# Patient Record
Sex: Female | Born: 1960 | Race: White | Hispanic: No | Marital: Married | Smoking: Never smoker
Health system: Southern US, Community
[De-identification: ages and names within clinical notes are randomized; demographics above are authoritative.]

## PROBLEM LIST (undated history)

## (undated) DIAGNOSIS — K219 Gastro-esophageal reflux disease without esophagitis: Secondary | ICD-10-CM

## (undated) DIAGNOSIS — E079 Disorder of thyroid, unspecified: Secondary | ICD-10-CM

## (undated) DIAGNOSIS — C50412 Malignant neoplasm of upper-outer quadrant of left female breast: Principal | ICD-10-CM

## (undated) DIAGNOSIS — C50919 Malignant neoplasm of unspecified site of unspecified female breast: Secondary | ICD-10-CM

## (undated) DIAGNOSIS — M1990 Unspecified osteoarthritis, unspecified site: Secondary | ICD-10-CM

## (undated) DIAGNOSIS — E039 Hypothyroidism, unspecified: Secondary | ICD-10-CM

## (undated) HISTORY — PX: ABDOMINAL HYSTERECTOMY: SHX81

## (undated) HISTORY — DX: Unspecified osteoarthritis, unspecified site: M19.90

## (undated) HISTORY — DX: Malignant neoplasm of unspecified site of unspecified female breast: C50.919

## (undated) HISTORY — DX: Disorder of thyroid, unspecified: E07.9

## (undated) HISTORY — DX: Malignant neoplasm of upper-outer quadrant of left female breast: C50.412

---

## 2015-03-27 ENCOUNTER — Other Ambulatory Visit: Payer: Self-pay | Admitting: *Deleted

## 2015-03-27 DIAGNOSIS — R200 Anesthesia of skin: Secondary | ICD-10-CM

## 2015-03-28 ENCOUNTER — Ambulatory Visit (INDEPENDENT_AMBULATORY_CARE_PROVIDER_SITE_OTHER): Payer: No Typology Code available for payment source | Admitting: Neurology

## 2015-03-28 DIAGNOSIS — R200 Anesthesia of skin: Secondary | ICD-10-CM

## 2015-03-28 DIAGNOSIS — M79602 Pain in left arm: Secondary | ICD-10-CM

## 2015-03-28 NOTE — Procedures (Signed)
[facility]  [address]  [place], [place] [address] Tel: [phone] Fax:  [phone] Test Date:  03/28/2015  Patient: Brooke Garrison DOB: 04-03-1961 Physician: Narda Amber  Sex: Female Height: 5' " Ref Phys: Sid Falcon, M.D.  ID#: [id number] Temp: 32.2C Technician: Jerilynn Mages. Dean   Patient Complaints: This is a 54 year old female with history of rheumatoid arthritis presenting for evaluation of bilateral hand pain and left hand paresthesias.   NCV & EMG Findings: Extensive electrodiagnostic testing of the left upper extremity and additional studies of the right shows:  1. All sensory responses including bilateral median, left radial, left ulnar, and bilateral palmar sensory responses are within normal limits.  2. Bilateral median and left ulnar motor responses within normal limits.  3. There is no evidence of active or chronic motor axon loss affecting any of the tested muscles. Motor unit configuration and recruitment pattern is within normal limits.   Impression: This is a normal study of the left upper extremity. In particular, there is no evidence of carpal tunnel syndrome or a cervical radiculopathy.   ___________________________ Narda Amber, DO    Nerve Conduction Studies Anti Sensory Summary Table   Stim Site NR Peak (ms) Norm Peak (ms) P-T Amp (V) Norm P-T Amp  Left Median Anti Sensory (2nd Digit)  32.2C  Wrist    2.9 <3.6 27.1 >15  Right Median Anti Sensory (2nd Digit)  32.2C  Wrist    3.0 <3.6 29.6 >15  Left Radial Anti Sensory (Base 1st Digit)  32.2C  Wrist    1.9 <2.7 48.7 >14  Left Ulnar Anti Sensory (5th Digit)  32.2C  Wrist    2.9 <3.1 25.6 >10   Motor Summary Table   Stim Site NR Onset (ms) Norm Onset (ms) O-P Amp (mV) Norm O-P Amp Site1 Site2 Delta-0 (ms) Dist (cm) Vel (m/s) Norm Vel (m/s)  Left Median Motor (Abd Poll Brev)  32.2C  Wrist    2.9 <4.0 9.3 >6 Elbow Wrist 3.7 20.0 54 >50  Elbow    6.6  8.1         Right  Median Motor (Abd Poll Brev)  32.2C  Wrist    2.9 <4.0 8.9 >6 Elbow Wrist 3.7 21.0 57 >50  Elbow    6.6  8.2         Left Ulnar Motor (Abd Dig Minimi)  32.2C  Wrist    2.6 <3.1 7.8 >7 B Elbow Wrist 2.6 16.0 62 >50  B Elbow    5.2  7.1  A Elbow B Elbow 1.6 10.0 63 >50  A Elbow    6.8  6.3          Comparison Summary Table   Stim Site NR Peak (ms) Norm Peak (ms) P-T Amp (V) Site1 Site2 Delta-P (ms) Norm Delta (ms)  Left Median/Ulnar Palm Comparison (Wrist - 8cm)  32.2C  Median Palm    1.8 <2.2 79.0 Median Palm Ulnar Palm 0.1   Ulnar Palm    1.7 <2.2 19.3      Right Median/Ulnar Palm Comparison (Wrist - 8cm)  32.2C  Median Palm    1.7 <2.2 59.7 Median Palm Ulnar Palm 0.1   Ulnar Palm    1.8 <2.2 24.2       EMG   Side Muscle Ins Act Fibs Psw Fasc Number Recrt Dur Dur. Amp Amp. Poly Poly. Comment  Left 1stDorInt Nml Nml Nml Nml Nml Nml Nml Nml Nml Nml Nml Nml N/A  Left Ext Indicis Nml Nml Nml Nml Nml Nml Nml Nml Nml Nml Nml Nml N/A  Left PronatorTeres Nml Nml Nml Nml Nml Nml Nml Nml Nml Nml Nml Nml N/A  Left Biceps Nml Nml Nml Nml Nml Nml Nml Nml Nml Nml Nml Nml N/A  Left Triceps Nml Nml Nml Nml Nml Nml Nml Nml Nml Nml Nml Nml N/A  Left Deltoid Nml Nml Nml Nml Nml Nml Nml Nml Nml Nml Nml Nml N/A      Waveforms:

## 2015-03-31 ENCOUNTER — Telehealth: Payer: Self-pay | Admitting: Neurology

## 2015-03-31 NOTE — Telephone Encounter (Signed)
Brooke Garrison, Dr. Charlestine Massed office/ called requesting results from nerve conduction study/ call back [phone]///fax # [phone]

## 2015-03-31 NOTE — Telephone Encounter (Signed)
Results faxed.

## 2015-09-18 ENCOUNTER — Other Ambulatory Visit: Payer: Self-pay | Admitting: Obstetrics & Gynecology

## 2015-09-18 DIAGNOSIS — N6325 Unspecified lump in the left breast, overlapping quadrants: Secondary | ICD-10-CM

## 2015-09-18 DIAGNOSIS — N6320 Unspecified lump in the left breast, unspecified quadrant: Secondary | ICD-10-CM

## 2015-09-25 ENCOUNTER — Ambulatory Visit
Admission: RE | Admit: 2015-09-25 | Discharge: 2015-09-25 | Disposition: A | Discharge status: Home / Self Care | Payer: Managed Care, Other (non HMO) | Source: Ambulatory Visit | Attending: Obstetrics & Gynecology | Admitting: Obstetrics & Gynecology

## 2015-09-25 ENCOUNTER — Other Ambulatory Visit: Payer: Self-pay | Admitting: Obstetrics & Gynecology

## 2015-09-25 DIAGNOSIS — N6325 Unspecified lump in the left breast, overlapping quadrants: Secondary | ICD-10-CM

## 2015-09-25 DIAGNOSIS — N6320 Unspecified lump in the left breast, unspecified quadrant: Principal | ICD-10-CM

## 2015-09-27 ENCOUNTER — Other Ambulatory Visit: Payer: Self-pay | Admitting: Obstetrics & Gynecology

## 2015-09-27 ENCOUNTER — Ambulatory Visit
Admission: RE | Admit: 2015-09-27 | Discharge: 2015-09-27 | Disposition: A | Discharge status: Home / Self Care | Payer: Managed Care, Other (non HMO) | Source: Ambulatory Visit | Attending: Obstetrics & Gynecology | Admitting: Obstetrics & Gynecology

## 2015-09-27 DIAGNOSIS — N6325 Unspecified lump in the left breast, overlapping quadrants: Secondary | ICD-10-CM

## 2015-09-27 DIAGNOSIS — N6320 Unspecified lump in the left breast, unspecified quadrant: Principal | ICD-10-CM

## 2015-09-29 ENCOUNTER — Encounter: Payer: Self-pay | Admitting: *Deleted

## 2015-09-29 ENCOUNTER — Telehealth: Payer: Self-pay | Admitting: *Deleted

## 2015-09-29 DIAGNOSIS — C50412 Malignant neoplasm of upper-outer quadrant of left female breast: Secondary | ICD-10-CM | POA: Insufficient documentation

## 2015-09-29 HISTORY — DX: Malignant neoplasm of upper-outer quadrant of left female breast: C50.412

## 2015-09-29 NOTE — Telephone Encounter (Signed)
Confirmed [facility] for 10/04/15 at 0815.  Instructions and contact information given.

## 2015-09-29 NOTE — Telephone Encounter (Signed)
Mailed clinic packet to pt.  

## 2015-10-03 NOTE — Progress Notes (Signed)
[facility]  Telephone:([phone]) [phone] Fax:([phone] [place] Note   Patient Care Team: Larence Penning, MD as PCP - General (Internal Medicine) Deliah Goody (Specialist) Janyth Pupa, DO as Consulting Physician (Obstetrics and Gynecology) 10/04/2015  CHIEF COMPLAINTS/PURPOSE OF CONSULTATION:  Newly diagnosed left breast cancer  Oncology History   Breast cancer of upper-outer quadrant of left female breast [facility])   Staging form: Breast, AJCC 7th Edition     Clinical stage from 09/28/2015: Stage IIA (T2, N0, M0) - Signed by Truitt Merle, MD on 10/03/2015       Breast cancer of upper-outer quadrant of left female breast ([place])   09/27/2015 Initial Diagnosis Breast cancer of upper-outer quadrant of left female breast ([place])   09/27/2015 Initial Biopsy Left breast core needle biopsy of the mass at the 3:00 position showed invasive and in situ ductal carcinoma, lymphovascular invasion is identified.   09/27/2015 Receptors her2 ER 95% positive, PR negative, HER-2 negative, Ki-67 60%    HISTORY OF PRESENTING ILLNESS:  Brooke Garrison 55 y.o. female is here because of er newly diagnosed left She is accompanied by her daughter-in-law her to our multidisciplinary breast clinic today.  She was found to have a palpable mass in her left breast by her GYN during her routine visit, no tenderness, skin change or nipple discharge. She denies any new syndroms, and feels well overall. she had mammogram one year ago which was normal, she had left breast biopsy which was benigh 20 years ago.   She ahs RA, on MTX and humaria for 4-5 years, she had significant multiple joint pain when she was diagnosed 6-7 years ago, and improved sighnitantly with treatment, she has mild joint pain, which get worse with physical exertion. She is married, liveswith her husband in [place], and works in [place]. Her husband had ruptured appendix about 6 weeks ago,is still recovering from surgery.    GYN HISTORY  Menarchal: 10 LMP: 2009 (hysrectomy)  Contraceptive:25 years  HRT: no  G2P2: she has 48 and 52 yo sons     MEDICAL HISTORY:  Past Medical History  Diagnosis Date  . Breast cancer of upper-outer quadrant of left female breast ([place]) 09/29/2015  . Breast cancer ([place])   . Arthritis   . Thyroid disease     SURGICAL HISTORY: Past Surgical History  Procedure Laterality Date  . Abdominal hysterectomy      SOCIAL HISTORY: Social History   Social History  . Marital Status: Unknown    Spouse Name: N/A  . Number of Children: N/A  . Years of Education: N/A   Occupational History  . Not on file.   Social History Main Topics  . Smoking status: Never Smoker   . Smokeless tobacco: Not on file  . Alcohol Use: Yes  . Drug Use: No  . Sexual Activity: Not on file   Other Topics Concern  . Not on file   Social History Narrative    FAMILY HISTORY: Family History  Problem Relation Age of Onset  . Hodgkin's lymphoma Mother   . Melanoma Maternal Grandfather     ALLERGIES:  is allergic to sulfa antibiotics.  MEDICATIONS:  Current Outpatient Prescriptions  Medication Sig Dispense Refill  . acyclovir (ZOVIRAX) 400 MG tablet Take 800 mg by mouth daily.  10  . cetirizine (ZYRTEC) 10 MG tablet Take 10 mg by mouth daily.    . folic acid (FOLVITE) 1 MG tablet Take 1 mg by mouth 2 (two) times daily.  5  . liothyronine (CYTOMEL) 5 MCG tablet Take 5 mcg by mouth daily.  5  . methotrexate (RHEUMATREX) 2.5 MG tablet Take 15 mg by mouth once a week. 6 tablets weekly.  3  . naproxen (NAPROSYN) 500 MG tablet Take 500 mg by mouth as needed.    Marland Kitchen TIROSINT 75 MCG CAPS TAKE ONE TABLET 6 DAYS A WEEK  6  . traMADol (ULTRAM) 50 MG tablet Take 50 mg by mouth as needed.     No current facility-administered medications for this visit.    REVIEW OF SYSTEMS:   Constitutional: Denies fevers, chills or abnormal night sweats Eyes: Denies blurriness of vision, double vision or watery  eyes Ears, nose, mouth, throat, and face: Denies mucositis or sore throat Respiratory: Denies cough, dyspnea or wheezes Cardiovascular: Denies palpitation, chest discomfort or lower extremity swelling Gastrointestinal:  Denies nausea, heartburn or change in bowel habits Skin: Denies abnormal skin rashes Lymphatics: Denies new lymphadenopathy or easy bruising Neurological:Denies numbness, tingling or new weaknesses Behavioral/Psych: Mood is stable, no new changes  All other systems were reviewed with the patient and are negative.  PHYSICAL EXAMINATION: ECOG PERFORMANCE STATUS: 1 - Symptomatic but completely ambulatory  Filed Vitals:   10/04/15 0850  BP: 116/77  Pulse: 64  Temp: 98 F (36.7 C)  Resp: 18   Filed Weights   10/04/15 0850  Weight: 185 lb 3.2 oz (84.006 kg)    GENERAL:alert, no distress and comfortable SKIN: skin color, texture, turgor are normal, no rashes or significant lesions EYES: normal, conjunctiva are pink and non-injected, sclera clear OROPHARYNX:no exudate, no erythema and lips, buccal mucosa, and tongue normal  NECK: supple, thyroid normal size, non-tender, without nodularity LYMPH:  no palpable lymphadenopathy in the cervical, axillary or inguinal LUNGS: clear to auscultation and percussion with normal breathing effort HEART: regular rate & rhythm and no murmurs and no lower extremity edema ABDOMEN:abdomen soft, non-tender and normal bowel sounds Musculoskeletal:no cyanosis of digits and no clubbing  PSYCH: alert & oriented x 3 with fluent speech NEURO: no focal motor/sensory deficits Breasts: Breast inspection showed them to be symmetrical with no nipple discharge. Palpation of the breasts and axilla revealed a 2.5X1.5cm mass in 3:00 position retroareolar. No other obvious mass that I could appreciate.   LABORATORY DATA:  I have reviewed the data as listed Lab Results  Component Value Date   WBC 8.2 10/04/2015   HGB 12.7 10/04/2015   HCT 38.6  10/04/2015   MCV 95.3 10/04/2015   PLT 248 10/04/2015    Recent Labs  10/04/15 0829  NA 141  K 4.0  CO2 28  GLUCOSE 119  BUN 18.7  CREATININE 0.8  CALCIUM 9.5  PROT 7.4  ALBUMIN 3.9  AST 17  ALT 17  ALKPHOS 68  BILITOT 0.57    PATHOLOGY REPORT   Diagnosis 09/27/2015 Breast, left, needle core biopsy, 3:00 o'clock, retroareolar - INVASIVE AND IN SITU MAMMARY CARCINOMA. - LYMPHOVASCULAR INVASION IS IDENTIFIED. - SEE COMMENT. Microscopic Comment The carcinoma appears grade 2. An E-cadherin stain will be performed and the results reported separately. A breast prognostic profile will be performed and the results reported separately. The results were called to [facility] on 09/28/2015. (JBK:ecj 09/28/2015) The malignant cells are positive for E-Cadherin, supporting a ductal phenotype.  Results: IMMUNOHISTOCHEMICAL AND MORPHOMETRIC ANALYSIS PERFORMED MANUALLY Estrogen Receptor: 95%, POSITIVE, STRONG STAINING INTENSITY Progesterone Receptor: 0%, NEGATIVE Proliferation Marker Ki67: 60% Results: HER2 - NEGATIVE RATIO OF HER2/CEP17 SIGNALS 1.04 AVERAGE HER2  COPY NUMBER PER CELL 1.20   RADIOGRAPHIC STUDIES: I have personally reviewed the radiological images as listed and agreed with the findings in the report. Mm Digital Diagnostic Unilat L  09/27/2015  CLINICAL DATA:  Status post ultrasound-guided core needle biopsy of a 2.3 cm mass in the 3 o'clock retroareolar left breast. EXAM: DIAGNOSTIC LEFT MAMMOGRAM POST ULTRASOUND BIOPSY COMPARISON:  Previous exam(s). FINDINGS: Mammographic images were obtained following ultrasound guided biopsy of the recently demonstrated 2.3 cm mass in the 3 o'clock retroareolar left breast. These demonstrate a ribbon shaped biopsy marker clip within the central portion of the biopsied mass. IMPRESSION: Appropriate clip deployment following left breast ultrasound-guided core needle biopsy. Final Assessment: Post Procedure Mammograms  for Marker Placement Electronically Signed   By: Claudie Revering M.D.   On: 09/27/2015 11:37   US Breast Ltd Uni Left Inc Axilla  09/25/2015  CLINICAL DATA:  Patient complains of a palpable abnormality in the left breast. EXAM: DIGITAL DIAGNOSTIC BILATERAL MAMMOGRAM WITH 3D TOMOSYNTHESIS WITH CAD ULTRASOUND LEFT BREAST COMPARISON:  Previous exam(s). ACR Breast Density Category b: There are scattered areas of fibroglandular density. FINDINGS: Parenchymal pattern of the right breast is stable compared to prior exams. No suspicious mass or malignant type microcalcifications identified. In the 2-3 o'clock region of the left breast is a spiculated 2.7 x 2.7 x 1.5 cm mass. There are no malignant type microcalcifications. No additional masses are seen in the left breast. Mammographic images were processed with CAD. On physical exam, I palpate a discrete hard mass in the left breast at 3 o'clock in the retroareolar location. Targeted ultrasound is performed, showing an irregular hypoechoic mass in the left breast at 3 o'clock in the retroareolar location measure 1.4 x 1.7 x 2.3 cm. Sonographic evaluation of the left axilla does not show any enlarged adenopathy. IMPRESSION: Suspicious left breast mass. RECOMMENDATION: Ultrasound-guided core biopsy has been scheduled on 09/27/2015 at 10 a.m. I have discussed the findings and recommendations with the patient. Results were also provided in writing at the conclusion of the visit. If applicable, a reminder letter will be sent to the patient regarding the next appointment. BI-RADS CATEGORY  5: Highly suggestive of malignancy. Electronically Signed   By: Lillia Mountain M.D.   On: 09/25/2015 14:51   Mm Diag Breast Tomo Bilateral  09/25/2015  CLINICAL DATA:  Patient complains of a palpable abnormality in the left breast. EXAM: DIGITAL DIAGNOSTIC BILATERAL MAMMOGRAM WITH 3D TOMOSYNTHESIS WITH CAD ULTRASOUND LEFT BREAST COMPARISON:  Previous exam(s). ACR Breast Density Category b:  There are scattered areas of fibroglandular density. FINDINGS: Parenchymal pattern of the right breast is stable compared to prior exams. No suspicious mass or malignant type microcalcifications identified. In the 2-3 o'clock region of the left breast is a spiculated 2.7 x 2.7 x 1.5 cm mass. There are no malignant type microcalcifications. No additional masses are seen in the left breast. Mammographic images were processed with CAD. On physical exam, I palpate a discrete hard mass in the left breast at 3 o'clock in the retroareolar location. Targeted ultrasound is performed, showing an irregular hypoechoic mass in the left breast at 3 o'clock in the retroareolar location measure 1.4 x 1.7 x 2.3 cm. Sonographic evaluation of the left axilla does not show any enlarged adenopathy. IMPRESSION: Suspicious left breast mass. RECOMMENDATION: Ultrasound-guided core biopsy has been scheduled on 09/27/2015 at 10 a.m. I have discussed the findings and recommendations with the patient. Results were also provided in writing at the conclusion of the  visit. If applicable, a reminder letter will be sent to the patient regarding the next appointment. BI-RADS CATEGORY  5: Highly suggestive of malignancy. Electronically Signed   By: Lillia Mountain M.D.   On: 09/25/2015 14:51   [place] Lt Breast Bx W Loc Dev 1st Lesion Img Bx Spec US Guide  09/28/2015  ADDENDUM REPORT: 09/28/2015 16:15 ADDENDUM: Pathology revealed GRADE II INVASIVE AND IN SITU MAMMARY CARCINOMA, LYMPHOVASCULAR INVASION IS IDENTIFIED of the Left breast at the 3:00 o'clock location. This was found to be concordant by Dr. Claudie Revering. Pathology results were discussed with the patient by telephone. The patient reported minimal bleeding and is doing well after the biopsy. Post biopsy instructions and care were reviewed and questions were answered. The patient was encouraged to call [facility] for any additional concerns. The patient was referred to  [facility] on October 04, 2015. Pathology results reported by Terie Purser, RN on 09/28/2015. Electronically Signed   By: Claudie Revering M.D.   On: 09/28/2015 16:15  09/28/2015  CLINICAL DATA:  2.3 cm mass in the 3 o'clock retroareolar region of the left breast with imaging findings suspicious for malignancy. EXAM: ULTRASOUND GUIDED LEFT BREAST CORE NEEDLE BIOPSY COMPARISON:  Previous exam(s). FINDINGS: I met with the patient and we discussed the procedure of ultrasound-guided biopsy, including benefits and alternatives. We discussed the high likelihood of a successful procedure. We discussed the risks of the procedure, including infection, bleeding, tissue injury, clip migration, and inadequate sampling. Informed written consent was given. The usual time-out protocol was performed immediately prior to the procedure. Using sterile technique and 1% Lidocaine as local anesthetic, under direct ultrasound visualization, a 12 gauge spring-loaded device was used to perform biopsy of the recently demonstrated 2.3 cm mass in the 3 o'clock retroareolar region of the left breast using a caudal approach. At the conclusion of the procedure a ribbon shaped tissue marker clip was deployed into the biopsy cavity. Follow up 2 view mammogram was performed and dictated separately. IMPRESSION: Ultrasound guided biopsy of a 2.3 cm mass in the 3 o'clock retroareolar left breast. No apparent complications. Electronically Signed: By: Claudie Revering M.D. On: 09/27/2015 11:01    ASSESSMENT & PLAN: 55 year old caucasian female, with past medical history of rheumatoid arthritis, on  And hypothyroidism, presented with palpable left breast mass.  1. Breast cancer of upper-outer quadrant of left female breast, cT2N0M0, stage IIA, ER+/PR-/HER2+- Ki67 60% -I reviewed her image and biopsy findings in great details with patient and her daughter-in-law -She was seen by  breast surgeon Dr. Donne Hazel Today, breast surgery was offered, option of lumpectomy versus mastectomy were discussed. Giving the size of the breast tumor, upfront breast conservation is probably possible, and patient is considering right breast reduction at same time, to achieve symmetry.  -I reviewed the natural history of breast cancer. Given her G2 tumor, high KI67, this could be a high risk tumor.  -I recommend OncotypeDX to further evaluate her risk of recurrence after surgery and the benefit of adjuvant chemotherapy.She agrees to proceed. -Although she has some medical comorbidities, she is relatively young with good performance status, she would be a good candidate for chemotherapy if needed. -Dr. Donne Hazel prefers to have new adjuvant chemo, if she needs chemotherapy, to decrease the size of her breast tumor, and make lumpectomy easier.   -She was also seen by radiation oncologist Dr. Pablo Ledger today, benefit and risks of adjuvant  radiation were discussed. She would like need adjuvant radiation after lumpectomy.  -Given her strong ER/PR positivity of her tumor cells, I recommend adjuvant antiestrogen therapy. She still in perimenopause I would consider tamoxifen first. The benefit and potential side effects were discussed with her, she agrees to try.  2. Rheumatology arthritis -She'll continue to follow-up with her rheumatologist -If she will need chemo, I will recommend her to hold methotrexateand and Humira   3. hypothyroidism -She will continue Synthroid and follow-  Plan -oncotype on her core needle biopsy sample -she will meet plastic surgeon to discuss right breast -I'll see her back 3 weeks decide about neuadjuvant chemo.   All questions were answered. The patient knows to call the clinic with any problems, questions or concerns. I spent 60 minutes counseling the patient face to face. The total time spent in the appointment was 55 minutes and more than 50% was on  counseling.     Truitt Merle, MD 10/04/2015

## 2015-10-04 ENCOUNTER — Ambulatory Visit
Admission: RE | Admit: 2015-10-04 | Discharge: 2015-10-04 | Disposition: A | Discharge status: Home / Self Care | Payer: Managed Care, Other (non HMO) | Source: Ambulatory Visit | Attending: Radiation Oncology | Admitting: Radiation Oncology

## 2015-10-04 ENCOUNTER — Other Ambulatory Visit (HOSPITAL_BASED_OUTPATIENT_CLINIC_OR_DEPARTMENT_OTHER): Payer: Managed Care, Other (non HMO)

## 2015-10-04 ENCOUNTER — Ambulatory Visit (HOSPITAL_BASED_OUTPATIENT_CLINIC_OR_DEPARTMENT_OTHER): Payer: Managed Care, Other (non HMO) | Admitting: Hematology

## 2015-10-04 ENCOUNTER — Encounter: Payer: Self-pay | Admitting: Hematology

## 2015-10-04 ENCOUNTER — Ambulatory Visit: Payer: Managed Care, Other (non HMO) | Attending: General Surgery | Admitting: Physical Therapy

## 2015-10-04 ENCOUNTER — Encounter: Payer: Self-pay | Admitting: *Deleted

## 2015-10-04 ENCOUNTER — Encounter: Payer: Self-pay | Admitting: Skilled Nursing Facility1

## 2015-10-04 ENCOUNTER — Encounter: Payer: Self-pay | Admitting: Physical Therapy

## 2015-10-04 VITALS — BP 116/77 | HR 64 | Temp 98.0°F | Resp 18 | Ht 60.0 in | Wt 185.2 lb

## 2015-10-04 DIAGNOSIS — Z170 Estrogen receptor positive status [ER+]: Secondary | ICD-10-CM

## 2015-10-04 DIAGNOSIS — C50412 Malignant neoplasm of upper-outer quadrant of left female breast: Secondary | ICD-10-CM

## 2015-10-04 DIAGNOSIS — E039 Hypothyroidism, unspecified: Secondary | ICD-10-CM | POA: Diagnosis not present

## 2015-10-04 DIAGNOSIS — M069 Rheumatoid arthritis, unspecified: Secondary | ICD-10-CM

## 2015-10-04 DIAGNOSIS — M25612 Stiffness of left shoulder, not elsewhere classified: Secondary | ICD-10-CM | POA: Insufficient documentation

## 2015-10-04 DIAGNOSIS — R293 Abnormal posture: Secondary | ICD-10-CM | POA: Insufficient documentation

## 2015-10-04 LAB — CBC WITH DIFFERENTIAL/PLATELET
BASO%: 1 % (ref 0.0–2.0)
BASOS ABS: 0.1 10*3/uL (ref 0.0–0.1)
EOS ABS: 0.1 10*3/uL (ref 0.0–0.5)
EOS%: 1.6 % (ref 0.0–7.0)
HEMATOCRIT: 38.6 % (ref 34.8–46.6)
HEMOGLOBIN: 12.7 g/dL (ref 11.6–15.9)
LYMPH#: 2.8 10*3/uL (ref 0.9–3.3)
LYMPH%: 33.5 % (ref 14.0–49.7)
MCH: 31.4 pg (ref 25.1–34.0)
MCHC: 32.9 g/dL (ref 31.5–36.0)
MCV: 95.3 fL (ref 79.5–101.0)
MONO#: 0.6 10*3/uL (ref 0.1–0.9)
MONO%: 7.6 % (ref 0.0–14.0)
NEUT#: 4.6 10*3/uL (ref 1.5–6.5)
NEUT%: 56.3 % (ref 38.4–76.8)
PLATELETS: 248 10*3/uL (ref 145–400)
RBC: 4.05 10*6/uL (ref 3.70–5.45)
RDW: 14.7 % — AB (ref 11.2–14.5)
WBC: 8.2 10*3/uL (ref 3.9–10.3)

## 2015-10-04 LAB — COMPREHENSIVE METABOLIC PANEL
ALBUMIN: 3.9 g/dL (ref 3.5–5.0)
ALK PHOS: 68 U/L (ref 40–150)
ALT: 17 U/L (ref 0–55)
ANION GAP: 8 meq/L (ref 3–11)
AST: 17 U/L (ref 5–34)
BILIRUBIN TOTAL: 0.57 mg/dL (ref 0.20–1.20)
BUN: 18.7 mg/dL (ref 7.0–26.0)
CALCIUM: 9.5 mg/dL (ref 8.4–10.4)
CO2: 28 mEq/L (ref 22–29)
CREATININE: 0.8 mg/dL (ref 0.6–1.1)
Chloride: 105 mEq/L (ref 98–109)
EGFR: 77 mL/min/{1.73_m2} — ABNORMAL LOW (ref >90)
GLUCOSE: 119 mg/dL (ref 70–140)
Potassium: 4 mEq/L (ref 3.5–5.1)
Sodium: 141 mEq/L (ref 136–145)
Total Protein: 7.4 g/dL (ref 6.4–8.3)

## 2015-10-04 NOTE — Progress Notes (Signed)
Radiation Oncology         [phone] ________________________________  Initial outpatient Consultation - Date: 10/04/2015   Name: Brooke Garrison MRN: [id number]   DOB: 31-Jul-1960  REFERRING PHYSICIAN: Rolm Bookbinder, MD  DIAGNOSIS AND STAGE: Breast cancer of upper-outer quadrant of left female breast [facility])   Staging form: Breast, AJCC 7th Edition     Clinical stage from 09/28/2015: Stage IIA (T2, N0, M0) - Signed by Truitt Merle, MD on 10/03/2015   HISTORY OF PRESENT ILLNESS::Brooke Garrison is a 55 y.o. female who presented with a palpable left breast mass. Mammogram and ultrasound confirmed a 1.4 by 1.7 by 2.3 cm mass. Biopsy showed a grade 2 invasive ductal carcinoma with lymphovascular invasion. This was ER positive, PR negative, and Her-2 negative, with a Ki67 of 60%. Her mother had Hodgkins Disease at 32. She lives in [place] but works in [place]. She is accompanied by her daughter-in-law. Her physician originally palpated this mass. She has reservations about treatment related to her mother's diagnosis with Hodgkins disease and complications she had related to her treatment.   PREVIOUS RADIATION THERAPY: No  Gynecologic History  Age at first menstrual period? 10  Are you still having periods? No             Approximate date of last period? N/A  Ever used hormone replacement? No Obstetric History:  How many children have you carried to term? 2              Your age at first live birth? 57  Pregnant now or trying to get pregnant? No  Have you used birth control pills or hormone shots for contraception? yes  If so, for how long (or approximate dates)? 25 years Health Maintenance:  Ever had a colonoscopy? yes                  If yes, date? 2010             Ever had a bone density? yes                  If yes, date? 2016  Date of last PAP smear? 2009              Date of FIRST mammogram? 2000?  Past medical, social and family history were reviewed in the electronic chart.  Review of symptoms was reviewed in the electronic chart. Medications were reviewed in the electronic chart.   PHYSICAL EXAM: BP: 116/77 mmHg, Pulse Rate: 64, Resp: 18, Temp: 98, Temp Source: Oral, SpO2: 98%, Weight, 185 lb 3.2 oz, Height 5'  This is a pleasant female in no distress. No palpable abnormalities of the right breast. Palpable left breast mass in the lateral aspect at about the 3 o' clock position which is mobile and measures around 3 cm. No overlying skin changes. No palpable supraclavicular or axillary adenopathy bilaterally.   IMPRESSION: Ms. Brooke Garrison is a 55 y.o female with T2 N0 Left breast cancer.   PLAN: I spoke to the patient today regarding her diagnosis and options for treatment. We discussed the equivalence in terms of survival and local failure between mastectomy and breast conservation. We discussed the role of radiation in decreasing local failures in patients who undergo lumpectomy. We discussed the process of simulation and the placement tattoos. We discussed 4-6 weeks of treatment as an outpatient. We discussed the possibility of asymptomatic lung damage. We discussed the low likelihood of secondary malignancies. We discussed the possible side  effects including but not limited to skin redness, fatigue, permanent skin darkening, and breast swelling.  We discussed the use of cardiac sparing with deep inspiration breath hold if needed. She will have genetic testing done prior to surgery.    We will send an [place] on her core specimen and consider neoadjuvant chemotherapy if she is motivated to have a lumpectomy. Dr. Donne Hazel also discussed up front surgery and reduction as she is interested in a smaller breast size. She will see plastic surgery and we await results of her Oncotype if she elects not to proceed with upfront surgery. .    I spent 15 minutes face to face with the patient and more than 50% of that time was spent in counseling and/or coordination of care.     ------------------------------------------------  Thea Silversmith, MD  This document serves as a record of services personally performed by Thea Silversmith, MD. It was created on her behalf by Jenell Milliner, a trained medical scribe. The creation of this record is based on the scribe's personal observations and the provider's statements to them. This document has been checked and approved by the attending provider.

## 2015-10-04 NOTE — Patient Instructions (Signed)

## 2015-10-04 NOTE — Progress Notes (Signed)
Subjective:     Patient ID: Brooke Garrison, female   DOB: Nov 02, 1960, 55 y.o.   MRN: [id number]  HPI   Review of Systems     Objective:   Physical Exam For the patient to understand and be given the tools to implement a healthy plant based diet during their cancer diagnosis.     Assessment:     Patient was seen today and found to be nervous and accompanied by her daughter. Pts ht 5', 185 pounds, BMI 36.2. Pt had been googleing nutrition and cancer so she had a lot of misinformation not based in fact but she was very open to the dietitians education and seemed relieved to here nutrition is simpler than the Internet touts. Pt states she enjoys candy and cookies.     Plan:     Dietitian educated the patient on implementing a plant based diet by incorporating more plant proteins, fruits, and vegetables. As a part of a healthy routine physical activity was discussed. Dietitian offered some strategies to limiting simple carbohydrates. The importance of legitimate, evidence based information was discussed and examples were given. A folder of evidence based information with a focus on a plant based diet and general nutrition during cancer was given to the patient.  As a part of the continuum of care the cancer dietitian's contact information was given to the patient in the event they would like to have a follow up appointment.

## 2015-10-04 NOTE — Progress Notes (Signed)
[place] Psychosocial Distress Screening Clinical Social Work  Clinical Social Work met with pt and her daughter in law at [facility] to introduce [place], discuss role of CSW/Pt and Family Support Team and was review distress screening protocol.  The patient scored a 9 on the Psychosocial Distress Thermometer which indicates severe distress. Clinical Social Worker met with pt to assess for distress and other psychosocial needs. Pt reports her cancer "is her least concern". Pt shared her husband ruptured his appendix six weeks ago and was very, very sick. He is still recovering, meanwhile they are renovating their house. She feels her breast cancer is "manageable", but her overall stress level is a combination of all the other variables. Pt reports to work at [place] and has a strong support network there of breast cancer survivors.  She appears to have good coping techniques for her anxiety.  CSW reviewed options and provided materials for additional support/programs at [facility]. CSW encouraged pt to reach out as needed for additional support.   [facility] DISTRESS SCREENING 10/04/2015  Screening Type Initial Screening  Distress experienced in past week (1-10) 9  Practical problem type Housing;Insurance;Work/school  Emotional problem type Nervousness/Anxiety;Adjusting to illness;Isolation/feeling alone;Feeling hopeless  Physical Problem type Other (comment)  Referral to clinical social work Yes  Referral to support programs Yes    Clinical Social Worker follow up needed: No.  If yes, follow up plan:  Loren Racer, [place]  [facility] Phone: [phone] Fax: [phone]

## 2015-10-04 NOTE — Therapy (Signed)
[place], [place], [address] Phone: [phone]   Fax:  [phone]  Physical Therapy Evaluation  Patient Details  Name: Brooke Garrison MRN: [id number] Date of Birth: [birth year] Referring Provider: Dr. Rolm Bookbinder  Encounter Date: 10/04/2015      PT End of Session - 10/04/15 1319    Visit Number 1   Number of Visits 1   PT Start Time 1150   PT Stop Time 1213   PT Time Calculation (min) 23 min   Activity Tolerance Patient tolerated treatment well   Behavior During Therapy [facility] for tasks assessed/performed      Past Medical History  Diagnosis Date  . Breast cancer of upper-outer quadrant of left female breast ([place]) 09/29/2015  . Breast cancer ([place])   . Arthritis   . Thyroid disease     Past Surgical History  Procedure Laterality Date  . Abdominal hysterectomy      There were no vitals filed for this visit.  Visit Diagnosis:  Breast cancer of upper-outer quadrant of left female breast ([place]) - Plan: PT plan of care cert/re-cert  Abnormal posture - Plan: PT plan of care cert/re-cert  Shoulder stiffness, left - Plan: PT plan of care cert/re-cert      Subjective Assessment - 10/04/15 1320    Subjective Patient reports she is here today for a baseline assessment of her newly diagnosed left breast cancer.   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  It is ER/PR positive, HER2 negative.  She also has rheumatoid arthritis which interferes some with shoulder ROM.   Patient Stated Goals Decrease lymphedema risk and learn post op shoulder ROM HEP            [facility] PT Assessment - 10/04/15 0001    Assessment   Medical Diagnosis Left breast cancer   Referring Provider Dr. Rolm Bookbinder   Onset Date/Surgical Date 09/25/15   Hand Dominance Left   Prior  Therapy none   Precautions   Precautions Other (comment)   Precaution Comments Active left breast cancer and rheumatoid arthritis   Restrictions   Weight Bearing Restrictions No   Balance Screen   Has the patient fallen in the past 6 months No   Has the patient had a decrease in activity level because of a fear of falling?  No   Is the patient reluctant to leave their home because of a fear of falling?  No   Home Environment   Living Environment Private residence   Living Arrangements Spouse/significant other;Children  Lives with husband and 64 y.o. 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 [employer] She does not exercise   Cognition   Overall Cognitive Status Within Functional Limits for tasks assessed   Posture/Postural Control   Posture/Postural Control Postural limitations   Postural Limitations Rounded Shoulders;Forward head   ROM / Strength   AROM / PROM / Strength Strength;AROM   AROM   AROM Assessment Site Shoulder   Right/Left Shoulder Right;Left   Right Shoulder Extension 52 Degrees   Right Shoulder Flexion 150 Degrees   Right Shoulder ABduction 151 Degrees   Right Shoulder Internal Rotation 60 Degrees  Right Shoulder External Rotation 70 Degrees   Left Shoulder Extension 50 Degrees   Left Shoulder Flexion 135 Degrees   Left Shoulder ABduction 140 Degrees   Left Shoulder Internal Rotation 35 Degrees  c/o tightness and pain   Left Shoulder External Rotation 75 Degrees   Strength   Overall Strength Within functional limits for tasks performed           LYMPHEDEMA/ONCOLOGY QUESTIONNAIRE - 10/04/15 1315    Type   Cancer Type Left breast cancer   Lymphedema Assessments   Lymphedema Assessments Upper extremities   Right Upper Extremity Lymphedema   10 cm Proximal to  Olecranon Process 30.8 cm   Olecranon Process 28 cm   10 cm Proximal to Ulnar Styloid Process 28 cm   Just Proximal to Ulnar Styloid Process 18.1 cm   Across Hand at [employer] 19.5 cm   At [place] of 2nd Digit 6.6 cm   Left Upper Extremity Lymphedema   10 cm Proximal to Olecranon Process 31.8 cm   Olecranon Process 26.9 cm   10 cm Proximal to Ulnar Styloid Process 27.3 cm   Just Proximal to Ulnar Styloid Process 19.8 cm   Across Hand at [employer] 19.1 cm   At [place] of 2nd Digit 6.5 cm      Patient was instructed today in a home exercise program today for post op shoulder range of motion. These included active assist shoulder flexion in sitting, scapular retraction, wall walking with shoulder abduction, and hands behind head external rotation.  She was encouraged to do these twice a day, holding 3 seconds and repeating 5 times when permitted by her physician.         PT Education - 10/04/15 1317    Education provided Yes   Education Details Lymphedema risk reduction and post op shoulder ROM HEP   Person(s) Educated Patient   Methods Explanation;Demonstration;Handout   Comprehension Verbalized understanding;Returned demonstration              Breast Clinic Goals - 10/04/15 1323    Patient will be able to verbalize understanding of pertinent lymphedema risk reduction practices relevant to her diagnosis specifically related to skin care.   Time 1   Period Days   Status Achieved   Patient will be able to return demonstrate and/or verbalize understanding of the post-op home exercise program related to regaining shoulder range of motion.   Time 1   Period Days   Status Achieved   Patient will be able to verbalize understanding of the importance of attending the postoperative After Breast Cancer Class for further lymphedema risk reduction education and therapeutic exercise.   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  It is ER/PR positive, HER2 negative.  She also has rheumatoid arthritis which interferes some with shoulder ROM.  She is planning to have a left lumpectomy and sentinel node biopsy followed by radiation and anti-estrogen therapy.  She will have Oncotype testing to determine a need for chemotherapy.  She may benefit from post op PT to regain shoulder ROM and reduce lymphedema risk.   Pt will benefit from skilled therapeutic intervention in order to improve on the following deficits Decreased strength;Decreased knowledge of precautions;Pain;Impaired UE functional  use;Decreased range of motion   Rehab Potential Excellent   Clinical Impairments Affecting Rehab Potential none   PT Frequency One time visit   PT Treatment/Interventions Therapeutic exercise;Patient/family education   PT Next Visit Plan Will f/u after surgery   PT Home Exercise Plan Shoulder ROM HEP for post op   Consulted and Agree with Plan of Care Patient;Family member/caregiver   Family Member Consulted daughter       Patient will follow up at outpatient cancer rehab if needed following surgery.  If the patient requires physical therapy at that time, a specific plan will be dictated and sent to the referring physician for approval. The patient was educated today on appropriate basic range of motion exercises to begin post operatively and the importance of attending the After Breast Cancer class following surgery.  Patient was educated today on lymphedema risk reduction practices as it pertains to recommendations that will benefit the patient immediately following surgery.  She verbalized good understanding.  No additional physical therapy is indicated at this time.      Problem List Patient Active Problem List   Diagnosis Date Noted  . Breast cancer of upper-outer quadrant of left female breast  ([place]) 09/29/2015   Annia Friendly, PT 10/04/2015 1:40 PM  [place], [place], [address] Phone: [phone]   Fax:  [phone]  Name: Brooke Garrison MRN: [id number] Date of Birth: 02/11/1961

## 2015-10-05 ENCOUNTER — Telehealth: Payer: Self-pay | Admitting: *Deleted

## 2015-10-05 NOTE — Telephone Encounter (Signed)
Received order per Dr. Burr Medico for oncotype testing. Requisition sent to pathology. Received by Brooke Garrison to pt concerning [place] from 10/05/15. Denies questions or concerns regarding dx or treatment care plan. Encourage pt to call with needs. Received verbal understanding.

## 2015-10-12 ENCOUNTER — Other Ambulatory Visit: Payer: Self-pay | Admitting: *Deleted

## 2015-10-12 ENCOUNTER — Telehealth: Payer: Self-pay | Admitting: Hematology

## 2015-10-12 ENCOUNTER — Encounter: Payer: Self-pay | Admitting: *Deleted

## 2015-10-12 NOTE — Progress Notes (Unsigned)
Received oncotype score of 55 on core biopsy.  Copy to Dr. Burr Medico and copy to medical records to scan.

## 2015-10-12 NOTE — Telephone Encounter (Signed)
cld pt and adv of appt for 3/31 @10 :15

## 2015-10-13 ENCOUNTER — Telehealth: Payer: Self-pay | Admitting: Hematology

## 2015-10-13 ENCOUNTER — Encounter: Payer: Self-pay | Admitting: Hematology

## 2015-10-13 ENCOUNTER — Ambulatory Visit (HOSPITAL_BASED_OUTPATIENT_CLINIC_OR_DEPARTMENT_OTHER): Payer: Managed Care, Other (non HMO) | Admitting: Hematology

## 2015-10-13 VITALS — BP 117/78 | HR 84 | Temp 98.5°F | Resp 18 | Ht 60.0 in | Wt 183.6 lb

## 2015-10-13 DIAGNOSIS — E039 Hypothyroidism, unspecified: Secondary | ICD-10-CM | POA: Diagnosis not present

## 2015-10-13 DIAGNOSIS — Z170 Estrogen receptor positive status [ER+]: Secondary | ICD-10-CM

## 2015-10-13 DIAGNOSIS — C50412 Malignant neoplasm of upper-outer quadrant of left female breast: Secondary | ICD-10-CM

## 2015-10-13 MED ORDER — DEXAMETHASONE 4 MG PO TABS: 8.0000 mg | ORAL_TABLET | Freq: Two times a day (BID) | ORAL | Status: DC

## 2015-10-13 MED ORDER — ONDANSETRON HCL 8 MG PO TABS
8.0000 mg | ORAL_TABLET | Freq: Two times a day (BID) | ORAL | Status: DC | PRN
Start: 2015-10-13 — End: 2016-04-12

## 2015-10-13 MED ORDER — PROCHLORPERAZINE MALEATE 10 MG PO TABS
10.0000 mg | ORAL_TABLET | Freq: Four times a day (QID) | ORAL | Status: DC | PRN
Start: 2015-10-13 — End: 2016-04-12

## 2015-10-13 MED ORDER — LORAZEPAM 0.5 MG PO TABS: 0.5000 mg | ORAL_TABLET | Freq: Once | ORAL | Status: DC

## 2015-10-13 NOTE — Progress Notes (Signed)
[facility]  Telephone:([phone]) [phone] Fax:(336) [phone]  Clinic Follow Up Note   Patient Care Team: Larence Penning, MD as PCP - General (Internal Medicine) Deliah Goody (Specialist) Janyth Pupa, DO as Consulting Physician (Obstetrics and Gynecology) Rolm Bookbinder, MD as Consulting Physician (General Surgery) Thea Silversmith, MD as Consulting Physician (Radiation Oncology) 10/13/2015  CHIEF COMPLAINTS:  Follow up left breast cancer  Oncology History   Breast cancer of upper-outer quadrant of left female breast [facility])   Staging form: Breast, AJCC 7th Edition     Clinical stage from 09/28/2015: Stage IIA (T2, N0, M0) - Signed by Truitt Merle, MD on 10/03/2015       Breast cancer of upper-outer quadrant of left female breast ([place])   09/27/2015 Initial Diagnosis Breast cancer of upper-outer quadrant of left female breast ([place])   09/27/2015 Initial Biopsy Left breast core needle biopsy of the mass at the 3:00 position showed invasive and in situ ductal carcinoma, lymphovascular invasion is identified.   09/27/2015 Receptors her2 ER 95% positive, PR negative, HER-2 negative, Ki-67 60%   09/27/2015 Mammogram diagnostic mammogram and of her left breast showa 2.3 cm mass in the 3:00 position retroareolar left breast   09/27/2015 Oncotype testing RS: 55 high risk, the group average risk of distant recurrence for patient's wishes recurrence score>50 was 34%.    HISTORY OF PRESENTING ILLNESS:  Brooke Garrison 55 y.o. female is here because of er newly diagnosed left She is accompanied by her daughter-in-law her to our multidisciplinary breast clinic today.  She was found to have a palpable mass in her left breast by her GYN during her routine visit, no tenderness, skin change or nipple discharge. She denies any new syndroms, and feels well overall. she had mammogram one year ago which was normal, she had left breast biopsy which was benigh 20 years ago.   She ahs RA, on MTX and  humaria for 4-5 years, she had significant multiple joint pain when she was diagnosed 6-7 years ago, and improved sighnitantly with treatment, she has mild joint pain, which get worse with physical exertion. She is married, liveswith her husband in [place], and works in [place]. Her husband had ruptured appendix about 6 weeks ago,is still recovering from surgery.   GYN HISTORY  Menarchal: 10 LMP: 2009 (hysrectomy)  Contraceptive:25 years  HRT: no  G2P2: she has 21 and 33 yo sons   CURRENT THERAPY: pending neoadjuvant chemo   INTERIM HISTORY:  Brooke returns for follow up and discuss her Oncotype DX test result.  She is doing well overall, no new complains since her last visit.    MEDICAL HISTORY:  Past Medical History  Diagnosis Date  . Breast cancer of upper-outer quadrant of left female breast ([place]) 09/29/2015  . Breast cancer ([place])   . Arthritis   . Thyroid disease     SURGICAL HISTORY: Past Surgical History  Procedure Laterality Date  . Abdominal hysterectomy      SOCIAL HISTORY: Social History   Social History  . Marital Status: Unknown    Spouse Name: N/A  . Number of Children: N/A  . Years of Education: N/A   Occupational History  . Not on file.   Social History Main Topics  . Smoking status: Never Smoker   . Smokeless tobacco: Not on file  . Alcohol Use: Yes  . Drug Use: No  . Sexual Activity: Not on file   Other Topics Concern  . Not on file   Social  History Narrative    FAMILY HISTORY: Family History  Problem Relation Age of Onset  . Hodgkin's lymphoma Mother   . Melanoma Maternal Grandfather     ALLERGIES:  is allergic to sulfa antibiotics.  MEDICATIONS:  Current Outpatient Prescriptions  Medication Sig Dispense Refill  . acyclovir (ZOVIRAX) 400 MG tablet Take 800 mg by mouth daily.  10  . cetirizine (ZYRTEC) 10 MG tablet Take 10 mg by mouth daily.    . folic acid (FOLVITE) 1 MG tablet Take 1 mg by mouth 2 (two) times daily.  5  .  liothyronine (CYTOMEL) 5 MCG tablet Take 5 mcg by mouth daily.  5  . methotrexate (RHEUMATREX) 2.5 MG tablet Take 15 mg by mouth once a week. 6 tablets weekly.  3  . naproxen (NAPROSYN) 500 MG tablet Take 500 mg by mouth as needed.    Marland Kitchen TIROSINT 75 MCG CAPS TAKE ONE TABLET 6 DAYS A WEEK  6  . traMADol (ULTRAM) 50 MG tablet Take 50 mg by mouth as needed.    Marland Kitchen LORazepam (ATIVAN) 0.5 MG tablet Take 1 tablet (0.5 mg total) by mouth once. 2 tablet 0   No current facility-administered medications for this visit.    REVIEW OF SYSTEMS:   Constitutional: Denies fevers, chills or abnormal night sweats Eyes: Denies blurriness of vision, double vision or watery eyes Ears, nose, mouth, throat, and face: Denies mucositis or sore throat Respiratory: Denies cough, dyspnea or wheezes Cardiovascular: Denies palpitation, chest discomfort or lower extremity swelling Gastrointestinal:  Denies nausea, heartburn or change in bowel habits Skin: Denies abnormal skin rashes Lymphatics: Denies new lymphadenopathy or easy bruising Neurological:Denies numbness, tingling or new weaknesses Behavioral/Psych: Mood is stable, no new changes  All other systems were reviewed with the patient and are negative.  PHYSICAL EXAMINATION: ECOG PERFORMANCE STATUS: 1 - Symptomatic but completely ambulatory  Filed Vitals:   10/13/15 1017  BP: 117/78  Pulse: 84  Temp: 98.5 F (36.9 C)  Resp: 18   Filed Weights   10/13/15 1017  Weight: 183 lb 9.6 oz (83.28 kg)    GENERAL:alert, no distress and comfortable SKIN: skin color, texture, turgor are normal, no rashes or significant lesions EYES: normal, conjunctiva are pink and non-injected, sclera clear OROPHARYNX:no exudate, no erythema and lips, buccal mucosa, and tongue normal  NECK: supple, thyroid normal size, non-tender, without nodularity LYMPH:  no palpable lymphadenopathy in the cervical, axillary or inguinal LUNGS: clear to auscultation and percussion with normal  breathing effort HEART: regular rate & rhythm and no murmurs and no lower extremity edema ABDOMEN:abdomen soft, non-tender and normal bowel sounds Musculoskeletal:no cyanosis of digits and no clubbing  PSYCH: alert & oriented x 3 with fluent speech NEURO: no focal motor/sensory deficits Breasts: Breast inspection showed them to be symmetrical with no nipple discharge. Palpation of the breasts and axilla revealed a 2.5X1.5cm mass in 3:00 position retroareolar. No other obvious mass that I could appreciate.   LABORATORY DATA:  I have reviewed the data as listed CBC Latest Ref Rng 10/04/2015  WBC 3.9 - 10.3 10e3/uL 8.2  Hemoglobin 11.6 - 15.9 g/dL 12.7  Hematocrit 34.8 - 46.6 % 38.6  Platelets 145 - 400 10e3/uL 248    CMP Latest Ref Rng 10/04/2015  Glucose 70 - 140 mg/dl 119  BUN 7.0 - 26.0 mg/dL 18.7  Creatinine 0.6 - 1.1 mg/dL 0.8  Sodium 136 - 145 mEq/L 141  Potassium 3.5 - 5.1 mEq/L 4.0  CO2 22 - 29 mEq/L 28  Calcium 8.4 - 10.4 mg/dL 9.5  Total Protein 6.4 - 8.3 g/dL 7.4  Total Bilirubin 0.20 - 1.20 mg/dL 0.57  Alkaline Phos 40 - 150 U/L 68  AST 5 - 34 U/L 17  ALT 0 - 55 U/L 17     PATHOLOGY REPORT   Diagnosis 09/27/2015 Breast, left, needle core biopsy, 3:00 o'clock, retroareolar - INVASIVE AND IN SITU MAMMARY CARCINOMA. - LYMPHOVASCULAR INVASION IS IDENTIFIED. - SEE COMMENT. Microscopic Comment The carcinoma appears grade 2. An E-cadherin stain will be performed and the results reported separately. A breast prognostic profile will be performed and the results reported separately. The results were called to [facility] on 09/28/2015. (JBK:ecj 09/28/2015) The malignant cells are positive for E-Cadherin, supporting a ductal phenotype.  Results: IMMUNOHISTOCHEMICAL AND MORPHOMETRIC ANALYSIS PERFORMED MANUALLY Estrogen Receptor: 95%, POSITIVE, STRONG STAINING INTENSITY Progesterone Receptor: 0%, NEGATIVE Proliferation Marker Ki67: 60% Results: HER2 -  NEGATIVE RATIO OF HER2/CEP17 SIGNALS 1.04 AVERAGE HER2 COPY NUMBER PER CELL 1.20  Oncotype DX RS: 55 high risk, the group average risk of distant recurrence for patient's wishes recurrence score>50 was 34%.  RADIOGRAPHIC STUDIES: I have personally reviewed the radiological images as listed and agreed with the findings in the report. Mm Digital Diagnostic Unilat L  09/27/2015  CLINICAL DATA:  Status post ultrasound-guided core needle biopsy of a 2.3 cm mass in the 3 o'clock retroareolar left breast. EXAM: DIAGNOSTIC LEFT MAMMOGRAM POST ULTRASOUND BIOPSY COMPARISON:  Previous exam(s). FINDINGS: Mammographic images were obtained following ultrasound guided biopsy of the recently demonstrated 2.3 cm mass in the 3 o'clock retroareolar left breast. These demonstrate a ribbon shaped biopsy marker clip within the central portion of the biopsied mass. IMPRESSION: Appropriate clip deployment following left breast ultrasound-guided core needle biopsy. Final Assessment: Post Procedure Mammograms for Marker Placement Electronically Signed   By: Claudie Revering M.D.   On: 09/27/2015 11:37   US Breast Ltd Uni Left Inc Axilla  09/25/2015  CLINICAL DATA:  Patient complains of a palpable abnormality in the left breast. EXAM: DIGITAL DIAGNOSTIC BILATERAL MAMMOGRAM WITH 3D TOMOSYNTHESIS WITH CAD ULTRASOUND LEFT BREAST COMPARISON:  Previous exam(s). ACR Breast Density Category b: There are scattered areas of fibroglandular density. FINDINGS: Parenchymal pattern of the right breast is stable compared to prior exams. No suspicious mass or malignant type microcalcifications identified. In the 2-3 o'clock region of the left breast is a spiculated 2.7 x 2.7 x 1.5 cm mass. There are no malignant type microcalcifications. No additional masses are seen in the left breast. Mammographic images were processed with CAD. On physical exam, I palpate a discrete hard mass in the left breast at 3 o'clock in the retroareolar location. Targeted  ultrasound is performed, showing an irregular hypoechoic mass in the left breast at 3 o'clock in the retroareolar location measure 1.4 x 1.7 x 2.3 cm. Sonographic evaluation of the left axilla does not show any enlarged adenopathy. IMPRESSION: Suspicious left breast mass. RECOMMENDATION: Ultrasound-guided core biopsy has been scheduled on 09/27/2015 at 10 a.m. I have discussed the findings and recommendations with the patient. Results were also provided in writing at the conclusion of the visit. If applicable, a reminder letter will be sent to the patient regarding the next appointment. BI-RADS CATEGORY  5: Highly suggestive of malignancy. Electronically Signed   By: Lillia Mountain M.D.   On: 09/25/2015 14:51   Mm Diag Breast Tomo Bilateral  09/25/2015  CLINICAL DATA:  Patient complains of a palpable abnormality in the left breast. EXAM:  DIGITAL DIAGNOSTIC BILATERAL MAMMOGRAM WITH 3D TOMOSYNTHESIS WITH CAD ULTRASOUND LEFT BREAST COMPARISON:  Previous exam(s). ACR Breast Density Category b: There are scattered areas of fibroglandular density. FINDINGS: Parenchymal pattern of the right breast is stable compared to prior exams. No suspicious mass or malignant type microcalcifications identified. In the 2-3 o'clock region of the left breast is a spiculated 2.7 x 2.7 x 1.5 cm mass. There are no malignant type microcalcifications. No additional masses are seen in the left breast. Mammographic images were processed with CAD. On physical exam, I palpate a discrete hard mass in the left breast at 3 o'clock in the retroareolar location. Targeted ultrasound is performed, showing an irregular hypoechoic mass in the left breast at 3 o'clock in the retroareolar location measure 1.4 x 1.7 x 2.3 cm. Sonographic evaluation of the left axilla does not show any enlarged adenopathy. IMPRESSION: Suspicious left breast mass. RECOMMENDATION: Ultrasound-guided core biopsy has been scheduled on 09/27/2015 at 10 a.m. I have discussed the  findings and recommendations with the patient. Results were also provided in writing at the conclusion of the visit. If applicable, a reminder letter will be sent to the patient regarding the next appointment. BI-RADS CATEGORY  5: Highly suggestive of malignancy. Electronically Signed   By: Lillia Mountain M.D.   On: 09/25/2015 14:51   [place] Lt Breast Bx W Loc Dev 1st Lesion Img Bx Spec US Guide  09/28/2015  ADDENDUM REPORT: 09/28/2015 16:15 ADDENDUM: Pathology revealed GRADE II INVASIVE AND IN SITU MAMMARY CARCINOMA, LYMPHOVASCULAR INVASION IS IDENTIFIED of the Left breast at the 3:00 o'clock location. This was found to be concordant by Dr. Claudie Revering. Pathology results were discussed with the patient by telephone. The patient reported minimal bleeding and is doing well after the biopsy. Post biopsy instructions and care were reviewed and questions were answered. The patient was encouraged to call [facility] for any additional concerns. The patient was referred to [facility] on October 04, 2015. Pathology results reported by Terie Purser, RN on 09/28/2015. Electronically Signed   By: Claudie Revering M.D.   On: 09/28/2015 16:15  09/28/2015  CLINICAL DATA:  2.3 cm mass in the 3 o'clock retroareolar region of the left breast with imaging findings suspicious for malignancy. EXAM: ULTRASOUND GUIDED LEFT BREAST CORE NEEDLE BIOPSY COMPARISON:  Previous exam(s). FINDINGS: I met with the patient and we discussed the procedure of ultrasound-guided biopsy, including benefits and alternatives. We discussed the high likelihood of a successful procedure. We discussed the risks of the procedure, including infection, bleeding, tissue injury, clip migration, and inadequate sampling. Informed written consent was given. The usual time-out protocol was performed immediately prior to the procedure. Using sterile technique and 1% Lidocaine  as local anesthetic, under direct ultrasound visualization, a 12 gauge spring-loaded device was used to perform biopsy of the recently demonstrated 2.3 cm mass in the 3 o'clock retroareolar region of the left breast using a caudal approach. At the conclusion of the procedure a ribbon shaped tissue marker clip was deployed into the biopsy cavity. Follow up 2 view mammogram was performed and dictated separately. IMPRESSION: Ultrasound guided biopsy of a 2.3 cm mass in the 3 o'clock retroareolar left breast. No apparent complications. Electronically Signed: By: Claudie Revering M.D. On: 09/27/2015 11:01    ASSESSMENT & PLAN: 55 year old caucasian female, with past medical history of rheumatoid arthritis, on  And hypothyroidism, presented with palpable left breast mass.  1. Breast cancer of upper-outer quadrant of left female breast, cT2N0M0, stage IIA, ER+/PR-/HER2+- Ki67 60% -I reviewed her image and biopsy findings in great details with patient and her daughter-in-law -She was seen by breast surgeon Dr. Donne Hazel Today, breast surgery was offered, option of lumpectomy versus mastectomy were discussed. Giving the size of the breast tumor, upfront breast conservation is probably possible, and patient is considering right breast reduction at same time, to achieve symmetry.  -I reviewed the natural history of breast cancer. Given her G2 tumor, high KI67, this could be a high risk tumor.  -I discussed her OncotypeDX test result with her in detail. Her RS is 47, which is very high risk (probably the highest risk we usually see),  Her  10 year risk of  Distant recurrence after surgery is around 34% with tamoxifen alone,  She would greatly benefit from chemotherapy , which will likely reduce her risk by 50% - based on her Oncotype DX test result, I highly recommend her consider neoadjuvant chemotherapy.  We discussed different chemotherapy regiment, given her node negative disease, I recommend docetaxel and Cytoxan ,  every 3 weeks, for total of 4 cycles. --Chemotherapy consent: Side effects including but does not not limited to, fatigue, nausea, vomiting, diarrhea, hair loss ( Including permanent hair loss), neuropathy, fluid retention, renal and kidney dysfunction, neutropenic fever, needed for blood transfusion, bleeding, were discussed with patient in great detail. She agrees to proceed. - we'll obtain a bilateral breast MRI as a baseline before chemotherapy -Dr. Donne Hazel also prefers to have new adjuvant chemo to decrease the size of her breast tumor, and make lumpectomy easier.     2. Rheumatology arthritis -She'll continue to follow-up with her rheumatologist -I spoke with her rhumatologist Dr. Billey Chang today and he agrees to hold methotrexateand (until complete RT) and Humira (at least for 5 years)  3. hypothyroidism -She will continue Synthroid and follow-  Plan - breast MRI with and without contrast within one week - chemotherapy class - first cycle chemotherapy TC  In 1 week - I'll see her back in 2 weeks for toxicity check up after first cycle chemotherapy  All questions were answered. The patient knows to call the clinic with any problems, questions or concerns. I spent 30 minutes counseling the patient face to face. The total time spent in the appointment was 40 minutes and more than 50% was on counseling.     Truitt Merle, MD 10/13/2015

## 2015-10-13 NOTE — Telephone Encounter (Signed)
Gave and printed appt shced and avs for pt for April °

## 2015-10-16 ENCOUNTER — Telehealth: Payer: Self-pay | Admitting: *Deleted

## 2015-10-16 MED ORDER — AZITHROMYCIN 250 MG PO TABS: ORAL_TABLET | ORAL | Status: DC

## 2015-10-16 NOTE — Telephone Encounter (Signed)
Received message from on-call RN from 10/15/15 & pt had requested ATB for cold, spasmotic cough, no fever.  Called pt & no yellow-green secretions or fever.  Suggested mucinex for upper resp congestion & cough med for cough, drink plenty of fluids & get in some Vit C & to call back if symptoms worse.  She was in agreement.

## 2015-10-16 NOTE — Addendum Note (Signed)
Addended by: Truitt Merle on: 10/16/2015 04:16 PM   Modules accepted: Orders

## 2015-10-16 NOTE — Telephone Encounter (Signed)
Notified pt that Dr Burr Medico would like her to start z-pak & script sent in electronically.

## 2015-10-17 ENCOUNTER — Telehealth: Payer: Self-pay | Admitting: *Deleted

## 2015-10-17 NOTE — Telephone Encounter (Signed)
Pt called to inform her MRI had to be cancelled d/t insurance is still pending. Pre-auth specialist is working on getting it approved. Informed Dr. Burr Medico and pt we will schedule as soon as we have pre-auth.

## 2015-10-18 ENCOUNTER — Telehealth: Payer: Self-pay | Admitting: *Deleted

## 2015-10-18 ENCOUNTER — Other Ambulatory Visit: Payer: Managed Care, Other (non HMO)

## 2015-10-18 NOTE — Telephone Encounter (Signed)
Received pre-auth for MRI. Confirmed appt date for 4/6 at 7:30pm.

## 2015-10-19 ENCOUNTER — Ambulatory Visit
Admission: RE | Admit: 2015-10-19 | Discharge: 2015-10-19 | Disposition: A | Discharge status: Home / Self Care | Payer: Managed Care, Other (non HMO) | Source: Ambulatory Visit | Attending: Hematology | Admitting: Hematology

## 2015-10-19 DIAGNOSIS — C50412 Malignant neoplasm of upper-outer quadrant of left female breast: Secondary | ICD-10-CM

## 2015-10-19 MED ORDER — GADOBENATE DIMEGLUMINE 529 MG/ML IV SOLN
17.0000 mL | Freq: Once | INTRAVENOUS | Status: AC | PRN
  Administered 2015-10-19: 17 mL via INTRAVENOUS

## 2015-10-20 ENCOUNTER — Other Ambulatory Visit: Payer: Self-pay | Admitting: Hematology

## 2015-10-20 ENCOUNTER — Encounter: Payer: Self-pay | Admitting: General Practice

## 2015-10-20 ENCOUNTER — Other Ambulatory Visit: Payer: Managed Care, Other (non HMO)

## 2015-10-20 ENCOUNTER — Other Ambulatory Visit: Payer: Self-pay | Admitting: *Deleted

## 2015-10-20 ENCOUNTER — Encounter: Payer: Self-pay | Admitting: Hematology

## 2015-10-20 ENCOUNTER — Encounter: Payer: Self-pay | Admitting: *Deleted

## 2015-10-20 ENCOUNTER — Ambulatory Visit (HOSPITAL_BASED_OUTPATIENT_CLINIC_OR_DEPARTMENT_OTHER): Payer: Managed Care, Other (non HMO)

## 2015-10-20 ENCOUNTER — Other Ambulatory Visit (HOSPITAL_BASED_OUTPATIENT_CLINIC_OR_DEPARTMENT_OTHER): Payer: Managed Care, Other (non HMO)

## 2015-10-20 VITALS — BP 112/58 | HR 52 | Temp 97.0°F | Resp 18

## 2015-10-20 DIAGNOSIS — C50412 Malignant neoplasm of upper-outer quadrant of left female breast: Secondary | ICD-10-CM

## 2015-10-20 DIAGNOSIS — Z5111 Encounter for antineoplastic chemotherapy: Secondary | ICD-10-CM | POA: Diagnosis not present

## 2015-10-20 DIAGNOSIS — Z5189 Encounter for other specified aftercare: Secondary | ICD-10-CM | POA: Diagnosis not present

## 2015-10-20 LAB — CBC WITH DIFFERENTIAL/PLATELET
BASO%: 0.3 % (ref 0.0–2.0)
BASOS ABS: 0.1 10*3/uL (ref 0.0–0.1)
EOS%: 0 % (ref 0.0–7.0)
Eosinophils Absolute: 0 10*3/uL (ref 0.0–0.5)
HEMATOCRIT: 39.3 % (ref 34.8–46.6)
HGB: 12.8 g/dL (ref 11.6–15.9)
LYMPH#: 2.8 10*3/uL (ref 0.9–3.3)
LYMPH%: 10.1 % — AB (ref 14.0–49.7)
MCH: 31 pg (ref 25.1–34.0)
MCHC: 32.6 g/dL (ref 31.5–36.0)
MCV: 95.1 fL (ref 79.5–101.0)
MONO#: 1.8 10*3/uL — AB (ref 0.1–0.9)
MONO%: 6.5 % (ref 0.0–14.0)
NEUT#: 23 10*3/uL — ABNORMAL HIGH (ref 1.5–6.5)
NEUT%: 83.1 % — AB (ref 38.4–76.8)
PLATELETS: 298 10*3/uL (ref 145–400)
RBC: 4.14 10*6/uL (ref 3.70–5.45)
RDW: 14.8 % — ABNORMAL HIGH (ref 11.2–14.5)
WBC: 27.7 10*3/uL — ABNORMAL HIGH (ref 3.9–10.3)

## 2015-10-20 LAB — COMPREHENSIVE METABOLIC PANEL
ALT: 21 U/L (ref 0–55)
ANION GAP: 11 meq/L (ref 3–11)
AST: 18 U/L (ref 5–34)
Albumin: 4.1 g/dL (ref 3.5–5.0)
Alkaline Phosphatase: 74 U/L (ref 40–150)
BUN: 21.5 mg/dL (ref 7.0–26.0)
CALCIUM: 10.1 mg/dL (ref 8.4–10.4)
CHLORIDE: 108 meq/L (ref 98–109)
CO2: 23 meq/L (ref 22–29)
CREATININE: 0.9 mg/dL (ref 0.6–1.1)
EGFR: 73 mL/min/{1.73_m2} — AB (ref >90)
Glucose: 114 mg/dl (ref 70–140)
POTASSIUM: 3.7 meq/L (ref 3.5–5.1)
Sodium: 141 mEq/L (ref 136–145)
Total Bilirubin: 0.32 mg/dL (ref 0.20–1.20)
Total Protein: 8 g/dL (ref 6.4–8.3)

## 2015-10-20 MED ORDER — SODIUM CHLORIDE 0.9 % IV SOLN
10.0000 mg | Freq: Once | INTRAVENOUS | Status: AC
  Administered 2015-10-20: 10 mg via INTRAVENOUS
  Filled 2015-10-20: qty 1

## 2015-10-20 MED ORDER — SODIUM CHLORIDE 0.9 % IV SOLN
600.0000 mg/m2 | Freq: Once | INTRAVENOUS | Status: AC
  Administered 2015-10-20: 1120 mg via INTRAVENOUS
  Filled 2015-10-20: qty 56

## 2015-10-20 MED ORDER — LORAZEPAM 0.5 MG PO TABS: 0.5000 mg | ORAL_TABLET | Freq: Four times a day (QID) | ORAL | Status: DC | PRN

## 2015-10-20 MED ORDER — SODIUM CHLORIDE 0.9 % IV SOLN
Freq: Once | INTRAVENOUS | Status: AC
  Administered 2015-10-20: 12:00:00 via INTRAVENOUS

## 2015-10-20 MED ORDER — PALONOSETRON HCL INJECTION 0.25 MG/5ML
0.2500 mg | Freq: Once | INTRAVENOUS | Status: AC
  Administered 2015-10-20: 0.25 mg via INTRAVENOUS

## 2015-10-20 MED ORDER — PEGFILGRASTIM 6 MG/0.6ML ~~LOC~~ PSKT
6.0000 mg | PREFILLED_SYRINGE | Freq: Once | SUBCUTANEOUS | Status: AC
  Administered 2015-10-20: 6 mg via SUBCUTANEOUS
  Filled 2015-10-20: qty 0.6

## 2015-10-20 MED ORDER — SODIUM CHLORIDE 0.9 % IV SOLN
75.0000 mg/m2 | Freq: Once | INTRAVENOUS | Status: AC
  Administered 2015-10-20: 140 mg via INTRAVENOUS
  Filled 2015-10-20: qty 14

## 2015-10-20 MED ORDER — PALONOSETRON HCL INJECTION 0.25 MG/5ML
INTRAVENOUS | Status: AC
  Filled 2015-10-20: qty 5

## 2015-10-20 NOTE — Progress Notes (Signed)
Spiritual Care Note  Referred by Brooke Garrison to f/u support in infusion, after family and I connected in chemo class, particularly because pt's husband, who goes by Brooke Garrison, was tearful and distressed.  Provided pastoral presence, reflective listening, normalization of feelings, and encouragement as they processed their experience.  Per Brooke Garrison, she often serves as an Astronomer of social interactions for Brooke Garrison because he has Asperger Syndrome.  They both describe themselves as experiencing easy tears as emotional release.  Brooke Garrison verbalized gratitude for the quality and thoroughness of care at [facility] [place] that such support tends not to be available for people who are dealing with other health concerns.  Per family, they have two sons, one of whom is married with three children (in [place], [place]) and one, age 57, lives at home (per pt, he is in college and also has Aspergers, which causes him social anxiety and leads to more deliberate and active parenting).  Per family, couple's parents are all deceased.  They are members of a [employer], which is a source of meaning and connection, but they note that it does not have a cancer care support group.  They welcomed and utilized spiritual care.  Brooke Garrison repeatedly verbalized desire for continued support.  Will follow, but please also page as needs arise.  Thank you.  [place], [place], [facility] Pager [phone] Voicemail  [phone]

## 2015-10-20 NOTE — Progress Notes (Signed)
Spiritual Care Note  Met Brooke Garrison and her husband Brooke Garrison in chemo class today.  Both expressed distress and vulnerability at Brooke Garrison's dx, unexpected need for chemo, and other health stressors (her rheumatoid arthritis and not being able to take humira, which she had found very helpful, due to breast ca dx; her thyroid disorder; his continued recovery from surgery in January).  They indicated appreciation for availability of emotional and spiritual support, using opportunity to share and process emotions.  Per pt, she has struggled with people's well-meaning but not constructive comments; normalized this as a common experience and emotional reaction, which family found helpful.  Identified ways that support programming--even at their distance in [place]--could be constructive and healing for them.  They welcome f/u support.  Plan to send handwritten note of encouragement and to f/u in infusion.  Please also page as needs arise.  Thank you.  [place], [place], [facility] Pager [phone] Voicemail  [phone]

## 2015-10-20 NOTE — Progress Notes (Signed)
Met w/ pt regarding copay assistance.  Pt has met her deductible for the year so copay assistance may not be needed.  Informed pt of the [employer] grant to help w/ transportation.  Pt applied & was approved for the $1000 grant.  Gave her an expense sheet & went over how the grant works.  She has my card in case anything changes w/ her ins.

## 2015-10-20 NOTE — Patient Instructions (Addendum)
[facility] Discharge Instructions for Patients Receiving Chemotherapy  Today you received the following chemotherapy agents Taxotere/Cytoxan To help prevent nausea and vomiting after your treatment, we encourage you to take your nausea medication as prescribed.   If you develop nausea and vomiting that is not controlled by your nausea medication, call the clinic.   BELOW ARE SYMPTOMS THAT SHOULD BE REPORTED IMMEDIATELY:  *FEVER GREATER THAN 100.5 F  *CHILLS WITH OR WITHOUT FEVER  NAUSEA AND VOMITING THAT IS NOT CONTROLLED WITH YOUR NAUSEA MEDICATION  *UNUSUAL SHORTNESS OF BREATH  *UNUSUAL BRUISING OR BLEEDING  TENDERNESS IN MOUTH AND THROAT WITH OR WITHOUT PRESENCE OF ULCERS  *URINARY PROBLEMS  *BOWEL PROBLEMS  UNUSUAL RASH Items with * indicate a potential emergency and should be followed up as soon as possible.  Feel free to call the clinic you have any questions or concerns. The clinic phone number is [phone].  Please show the [facility] at check-in to the Emergency Department and triage nurse.   Docetaxel injection (Taxotere) What is this medicine? DOCETAXEL (doe se TAX el) is a chemotherapy drug. It targets fast dividing cells, like cancer cells, and causes these cells to die. This medicine is used to treat many types of cancers like breast cancer, certain stomach cancers, head and neck cancer, lung cancer, and prostate cancer. This medicine may be used for other purposes; ask your health care provider or pharmacist if you have questions. What should I tell my health care provider before I take this medicine? They need to know if you have any of these conditions: -infection (especially a virus infection such as chickenpox, cold sores, or herpes) -liver disease -low blood counts, like low white cell, platelet, or red cell counts -an unusual or allergic reaction to docetaxel, polysorbate 80, other chemotherapy agents, other medicines, foods,  dyes, or preservatives -pregnant or trying to get pregnant -breast-feeding How should I use this medicine? This drug is given as an infusion into a vein. It is administered in a hospital or clinic by a specially trained health care professional. Talk to your pediatrician regarding the use of this medicine in children. Special care may be needed. Overdosage: If you think you have taken too much of this medicine contact a poison control center or emergency room at once. NOTE: This medicine is only for you. Do not share this medicine with others. What if I miss a dose? It is important not to miss your dose. Call your doctor or health care professional if you are unable to keep an appointment. What may interact with this medicine? -cyclosporine -erythromycin -ketoconazole -medicines to increase blood counts like filgrastim, pegfilgrastim, sargramostim -vaccines Talk to your doctor or health care professional before taking any of these medicines: -acetaminophen -aspirin -ibuprofen -ketoprofen -naproxen This list may not describe all possible interactions. Give your health care provider a list of all the medicines, herbs, non-prescription drugs, or dietary supplements you use. Also tell them if you smoke, drink alcohol, or use illegal drugs. Some items may interact with your medicine. What should I watch for while using this medicine? Your condition will be monitored carefully while you are receiving this medicine. You will need important blood work done while you are taking this medicine. This drug may make you feel generally unwell. This is not uncommon, as chemotherapy can affect healthy cells as well as cancer cells. Report any side effects. Continue your course of treatment even though you feel ill unless your doctor tells you to  stop. In some cases, you may be given additional medicines to help with side effects. Follow all directions for their use. Call your doctor or health care  professional for advice if you get a fever, chills or sore throat, or other symptoms of a cold or flu. Do not treat yourself. This drug decreases your body's ability to fight infections. Try to avoid being around people who are sick. This medicine may increase your risk to bruise or bleed. Call your doctor or health care professional if you notice any unusual bleeding. This medicine may contain alcohol in the product. You may get drowsy or dizzy. Do not drive, use machinery, or do anything that needs mental alertness until you know how this medicine affects you. Do not stand or sit up quickly, especially if you are an older patient. This reduces the risk of dizzy or fainting spells. Avoid alcoholic drinks. Do not become pregnant while taking this medicine. Women should inform their doctor if they wish to become pregnant or think they might be pregnant. There is a potential for serious side effects to an unborn child. Talk to your health care professional or pharmacist for more information. Do not breast-feed an infant while taking this medicine. What side effects may I notice from receiving this medicine? Side effects that you should report to your doctor or health care professional as soon as possible: -allergic reactions like skin rash, itching or hives, swelling of the face, lips, or tongue -low blood counts - This drug may decrease the number of white blood cells, red blood cells and platelets. You may be at increased risk for infections and bleeding. -signs of infection - fever or chills, cough, sore throat, pain or difficulty passing urine -signs of decreased platelets or bleeding - bruising, pinpoint red spots on the skin, black, tarry stools, nosebleeds -signs of decreased red blood cells - unusually weak or tired, fainting spells, lightheadedness -breathing problems -fast or irregular heartbeat -low blood pressure -mouth sores -nausea and vomiting -pain, swelling, redness or irritation at  the injection site -pain, tingling, numbness in the hands or feet -swelling of the ankle, feet, hands -weight gain Side effects that usually do not require medical attention (report to your prescriber or health care professional if they continue or are bothersome): -bone pain -complete hair loss including hair on your head, underarms, pubic hair, eyebrows, and eyelashes -diarrhea -excessive tearing -changes in the color of fingernails -loosening of the fingernails -nausea -muscle pain -red flush to skin -sweating -weak or tired This list may not describe all possible side effects. Call your doctor for medical advice about side effects. You may report side effects to FDA at 1-800-FDA-1088. Where should I keep my medicine? This drug is given in a hospital or clinic and will not be stored at home. NOTE: This sheet is a summary. It may not cover all possible information. If you have questions about this medicine, talk to your doctor, pharmacist, or health care provider.    2016, [employer]. (2014-07-18 16:04:57)  Cyclophosphamide injection (Cytoxan) What is this medicine? CYCLOPHOSPHAMIDE (sye kloe FOSS fa mide) is a chemotherapy drug. It slows the growth of cancer cells. This medicine is used to treat many types of cancer like lymphoma, myeloma, leukemia, breast cancer, and ovarian cancer, to name a few. This medicine may be used for other purposes; ask your health care provider or pharmacist if you have questions. What should I tell my health care provider before I take this medicine? They need to  know if you have any of these conditions: -blood disorders -history of other chemotherapy -infection -kidney disease -liver disease -recent or ongoing radiation therapy -tumors in the bone marrow -an unusual or allergic reaction to cyclophosphamide, other chemotherapy, other medicines, foods, dyes, or preservatives -pregnant or trying to get pregnant -breast-feeding How  should I use this medicine? This drug is usually given as an injection into a vein or muscle or by infusion into a vein. It is administered in a hospital or clinic by a specially trained health care professional. Talk to your pediatrician regarding the use of this medicine in children. Special care may be needed. Overdosage: If you think you have taken too much of this medicine contact a poison control center or emergency room at once. NOTE: This medicine is only for you. Do not share this medicine with others. What if I miss a dose? It is important not to miss your dose. Call your doctor or health care professional if you are unable to keep an appointment. What may interact with this medicine? This medicine may interact with the following medications: -amiodarone -amphotericin B -azathioprine -certain antiviral medicines for HIV or AIDS such as protease inhibitors (e.g., indinavir, ritonavir) and zidovudine -certain blood pressure medications such as benazepril, captopril, enalapril, fosinopril, lisinopril, moexipril, monopril, perindopril, quinapril, ramipril, trandolapril -certain cancer medications such as anthracyclines (e.g., daunorubicin, doxorubicin), busulfan, cytarabine, paclitaxel, pentostatin, tamoxifen, trastuzumab -certain diuretics such as chlorothiazide, chlorthalidone, hydrochlorothiazide, indapamide, metolazone -certain medicines that treat or prevent blood clots like warfarin -certain muscle relaxants such as succinylcholine -cyclosporine -etanercept -indomethacin -medicines to increase blood counts like filgrastim, pegfilgrastim, sargramostim -medicines used as general anesthesia -metronidazole -natalizumab This list may not describe all possible interactions. Give your health care provider a list of all the medicines, herbs, non-prescription drugs, or dietary supplements you use. Also tell them if you smoke, drink alcohol, or use illegal drugs. Some items may interact  with your medicine. What should I watch for while using this medicine? Visit your doctor for checks on your progress. This drug may make you feel generally unwell. This is not uncommon, as chemotherapy can affect healthy cells as well as cancer cells. Report any side effects. Continue your course of treatment even though you feel ill unless your doctor tells you to stop. Drink water or other fluids as directed. Urinate often, even at night. In some cases, you may be given additional medicines to help with side effects. Follow all directions for their use. Call your doctor or health care professional for advice if you get a fever, chills or sore throat, or other symptoms of a cold or flu. Do not treat yourself. This drug decreases your body's ability to fight infections. Try to avoid being around people who are sick. This medicine may increase your risk to bruise or bleed. Call your doctor or health care professional if you notice any unusual bleeding. Be careful brushing and flossing your teeth or using a toothpick because you may get an infection or bleed more easily. If you have any dental work done, tell your dentist you are receiving this medicine. You may get drowsy or dizzy. Do not drive, use machinery, or do anything that needs mental alertness until you know how this medicine affects you. Do not become pregnant while taking this medicine or for 1 year after stopping it. Women should inform their doctor if they wish to become pregnant or think they might be pregnant. Men should not father a child while taking this medicine and  for 4 months after stopping it. There is a potential for serious side effects to an unborn child. Talk to your health care professional or pharmacist for more information. Do not breast-feed an infant while taking this medicine. This medicine may interfere with the ability to have a child. This medicine has caused ovarian failure in some women. This medicine has caused reduced  sperm counts in some men. You should talk with your doctor or health care professional if you are concerned about your fertility. If you are going to have surgery, tell your doctor or health care professional that you have taken this medicine. What side effects may I notice from receiving this medicine? Side effects that you should report to your doctor or health care professional as soon as possible: -allergic reactions like skin rash, itching or hives, swelling of the face, lips, or tongue -low blood counts - this medicine may decrease the number of white blood cells, red blood cells and platelets. You may be at increased risk for infections and bleeding. -signs of infection - fever or chills, cough, sore throat, pain or difficulty passing urine -signs of decreased platelets or bleeding - bruising, pinpoint red spots on the skin, black, tarry stools, blood in the urine -signs of decreased red blood cells - unusually weak or tired, fainting spells, lightheadedness -breathing problems -dark urine -dizziness -palpitations -swelling of the ankles, feet, hands -trouble passing urine or change in the amount of urine -weight gain -yellowing of the eyes or skin Side effects that usually do not require medical attention (report to your doctor or health care professional if they continue or are bothersome): -changes in nail or skin color -hair loss -missed menstrual periods -mouth sores -nausea, vomiting This list may not describe all possible side effects. Call your doctor for medical advice about side effects. You may report side effects to FDA at 1-800-FDA-1088. Where should I keep my medicine? This drug is given in a hospital or clinic and will not be stored at home. NOTE: This sheet is a summary. It may not cover all possible information. If you have questions about this medicine, talk to your doctor, pharmacist, or health care provider.    2016, [employer] Standard. (2012-05-15  16:22:58)   Pegfilgrastim injection (Neulasta) What is this medicine? PEGFILGRASTIM (PEG fil gra stim) is a long-acting granulocyte colony-stimulating factor that stimulates the growth of neutrophils, a type of white blood cell important in the body's fight against infection. It is used to reduce the incidence of fever and infection in patients with certain types of cancer who are receiving chemotherapy that affects the bone marrow, and to increase survival after being exposed to high doses of radiation. This medicine may be used for other purposes; ask your health care provider or pharmacist if you have questions. What should I tell my health care provider before I take this medicine? They need to know if you have any of these conditions: -kidney disease -latex allergy -ongoing radiation therapy -sickle cell disease -skin reactions to acrylic adhesives (On-Body Injector only) -an unusual or allergic reaction to pegfilgrastim, filgrastim, other medicines, foods, dyes, or preservatives -pregnant or trying to get pregnant -breast-feeding How should I use this medicine? This medicine is for injection under the skin. If you get this medicine at home, you will be taught how to prepare and give the pre-filled syringe or how to use the On-body Injector. Refer to the patient Instructions for Use for detailed instructions. Use exactly as directed. Take your medicine at  regular intervals. Do not take your medicine more often than directed. It is important that you put your used needles and syringes in a special sharps container. Do not put them in a trash can. If you do not have a sharps container, call your pharmacist or healthcare provider to get one. Talk to your pediatrician regarding the use of this medicine in children. While this drug may be prescribed for selected conditions, precautions do apply. Overdosage: If you think you have taken too much of this medicine contact a poison control center or  emergency room at once. NOTE: This medicine is only for you. Do not share this medicine with others. What if I miss a dose? It is important not to miss your dose. Call your doctor or health care professional if you miss your dose. If you miss a dose due to an On-body Injector failure or leakage, a new dose should be administered as soon as possible using a single prefilled syringe for manual use. What may interact with this medicine? Interactions have not been studied. Give your health care provider a list of all the medicines, herbs, non-prescription drugs, or dietary supplements you use. Also tell them if you smoke, drink alcohol, or use illegal drugs. Some items may interact with your medicine. This list may not describe all possible interactions. Give your health care provider a list of all the medicines, herbs, non-prescription drugs, or dietary supplements you use. Also tell them if you smoke, drink alcohol, or use illegal drugs. Some items may interact with your medicine. What should I watch for while using this medicine? You may need blood work done while you are taking this medicine. If you are going to need a MRI, CT scan, or other procedure, tell your doctor that you are using this medicine (On-Body Injector only). What side effects may I notice from receiving this medicine? Side effects that you should report to your doctor or health care professional as soon as possible: -allergic reactions like skin rash, itching or hives, swelling of the face, lips, or tongue -dizziness -fever -pain, redness, or irritation at site where injected -pinpoint red spots on the skin -red or dark-brown urine -shortness of breath or breathing problems -stomach or side pain, or pain at the shoulder -swelling -tiredness -trouble passing urine or change in the amount of urine Side effects that usually do not require medical attention (report to your doctor or health care professional if they continue or  are bothersome): -bone pain -muscle pain This list may not describe all possible side effects. Call your doctor for medical advice about side effects. You may report side effects to FDA at 1-800-FDA-1088. Where should I keep my medicine? Keep out of the reach of children. Store pre-filled syringes in a refrigerator between 2 and 8 degrees C (36 and 46 degrees F). Do not freeze. Keep in carton to protect from light. Throw away this medicine if it is left out of the refrigerator for more than 48 hours. Throw away any unused medicine after the expiration date. NOTE: This sheet is a summary. It may not cover all possible information. If you have questions about this medicine, talk to your doctor, pharmacist, or health care provider.    2016, [employer] Standard. (2014-07-21 14:30:14)

## 2015-10-20 NOTE — Progress Notes (Signed)
Patient states she has a non productive cough since 10/16/15. Patient states she called Dr. Burr Medico and took an antibiotic. Patient denies shortness of breath, chest tightness, difficulty breathing. Patient states she is not coughing up sputum. Dr. Burr Medico made aware. Proceed with chemotherapy today. Patient verbalized understanding to call with any further questions or concerns.

## 2015-10-23 ENCOUNTER — Other Ambulatory Visit: Payer: Self-pay | Admitting: Hematology

## 2015-10-23 DIAGNOSIS — C50412 Malignant neoplasm of upper-outer quadrant of left female breast: Secondary | ICD-10-CM

## 2015-10-24 ENCOUNTER — Other Ambulatory Visit: Payer: Self-pay | Admitting: Hematology

## 2015-10-24 ENCOUNTER — Telehealth: Payer: Self-pay | Admitting: Hematology

## 2015-10-24 DIAGNOSIS — R928 Other abnormal and inconclusive findings on diagnostic imaging of breast: Secondary | ICD-10-CM

## 2015-10-24 NOTE — Telephone Encounter (Signed)
Faxed pt medical records to [facility] [phone] °

## 2015-10-24 NOTE — Telephone Encounter (Signed)
Added 4/28 appt per 4/7 pof. Pt will get updated sch on 4/13 visit

## 2015-10-25 ENCOUNTER — Other Ambulatory Visit: Payer: Managed Care, Other (non HMO)

## 2015-10-26 ENCOUNTER — Other Ambulatory Visit (HOSPITAL_BASED_OUTPATIENT_CLINIC_OR_DEPARTMENT_OTHER): Payer: Managed Care, Other (non HMO)

## 2015-10-26 ENCOUNTER — Other Ambulatory Visit: Payer: Self-pay | Admitting: Hematology

## 2015-10-26 ENCOUNTER — Encounter: Payer: Self-pay | Admitting: Hematology

## 2015-10-26 ENCOUNTER — Ambulatory Visit (HOSPITAL_BASED_OUTPATIENT_CLINIC_OR_DEPARTMENT_OTHER): Payer: Managed Care, Other (non HMO) | Admitting: Hematology

## 2015-10-26 ENCOUNTER — Ambulatory Visit (HOSPITAL_BASED_OUTPATIENT_CLINIC_OR_DEPARTMENT_OTHER): Payer: Managed Care, Other (non HMO)

## 2015-10-26 VITALS — BP 122/70 | HR 73 | Temp 98.3°F | Resp 18 | Ht 60.0 in | Wt 181.6 lb

## 2015-10-26 DIAGNOSIS — C50412 Malignant neoplasm of upper-outer quadrant of left female breast: Secondary | ICD-10-CM

## 2015-10-26 DIAGNOSIS — K5900 Constipation, unspecified: Secondary | ICD-10-CM

## 2015-10-26 DIAGNOSIS — Z170 Estrogen receptor positive status [ER+]: Secondary | ICD-10-CM | POA: Diagnosis not present

## 2015-10-26 DIAGNOSIS — E039 Hypothyroidism, unspecified: Secondary | ICD-10-CM | POA: Diagnosis not present

## 2015-10-26 LAB — CBC WITH DIFFERENTIAL/PLATELET
BASO%: 0.7 % (ref 0.0–2.0)
Basophils Absolute: 0 10*3/uL (ref 0.0–0.1)
EOS ABS: 0.1 10*3/uL (ref 0.0–0.5)
EOS%: 2.3 % (ref 0.0–7.0)
HCT: 38.3 % (ref 34.8–46.6)
HGB: 12.5 g/dL (ref 11.6–15.9)
LYMPH%: 56.9 % — ABNORMAL HIGH (ref 14.0–49.7)
MCH: 31.3 pg (ref 25.1–34.0)
MCHC: 32.7 g/dL (ref 31.5–36.0)
MCV: 95.6 fL (ref 79.5–101.0)
MONO#: 0.8 10*3/uL (ref 0.1–0.9)
MONO%: 13.5 % (ref 0.0–14.0)
NEUT%: 26.6 % — ABNORMAL LOW (ref 38.4–76.8)
NEUTROS ABS: 1.5 10*3/uL (ref 1.5–6.5)
Platelets: 152 10*3/uL (ref 145–400)
RBC: 4 10*6/uL (ref 3.70–5.45)
RDW: 14.9 % — AB (ref 11.2–14.5)
WBC: 5.7 10*3/uL (ref 3.9–10.3)
lymph#: 3.3 10*3/uL (ref 0.9–3.3)

## 2015-10-26 LAB — COMPREHENSIVE METABOLIC PANEL
ALBUMIN: 3.7 g/dL (ref 3.5–5.0)
ALK PHOS: 79 U/L (ref 40–150)
ALT: 27 U/L (ref 0–55)
AST: 18 U/L (ref 5–34)
Anion Gap: 10 mEq/L (ref 3–11)
BILIRUBIN TOTAL: 0.98 mg/dL (ref 0.20–1.20)
BUN: 21.2 mg/dL (ref 7.0–26.0)
CO2: 23 meq/L (ref 22–29)
CREATININE: 0.9 mg/dL (ref 0.6–1.1)
Calcium: 9.2 mg/dL (ref 8.4–10.4)
Chloride: 106 mEq/L (ref 98–109)
EGFR: 76 mL/min/{1.73_m2} — ABNORMAL LOW (ref >90)
GLUCOSE: 95 mg/dL (ref 70–140)
Potassium: 4.1 mEq/L (ref 3.5–5.1)
SODIUM: 139 meq/L (ref 136–145)
TOTAL PROTEIN: 6.8 g/dL (ref 6.4–8.3)

## 2015-10-26 MED ORDER — SODIUM CHLORIDE 0.9 % IV SOLN
Freq: Once | INTRAVENOUS | Status: AC
  Administered 2015-10-26: 11:00:00 via INTRAVENOUS

## 2015-10-26 NOTE — Progress Notes (Signed)
[name]  [phone] [phone]  Clinic Follow Up Note   Patient Care Team: Larence Penning, MD as PCP - General (Internal Medicine) Deliah Goody (Specialist) Janyth Pupa, DO as Consulting Physician (Obstetrics and Gynecology) Rolm Bookbinder, MD as Consulting Physician (General Surgery) Thea Silversmith, MD as Consulting Physician (Radiation Oncology) 10/26/2015  CHIEF COMPLAINTS:  Follow up left breast cancer  Oncology History   Breast cancer of upper-outer quadrant of left female breast [facility])   Staging form: Breast, AJCC 7th Edition     Clinical stage from 09/28/2015: Stage IIA (T2, N0, M0) - Signed by Truitt Merle, MD on 10/03/2015       Breast cancer of upper-outer quadrant of left female breast ([place])   09/27/2015 Initial Diagnosis Breast cancer of upper-outer quadrant of left female breast ([place])   09/27/2015 Initial Biopsy Left breast core needle biopsy of the mass at the 3:00 position showed invasive and in situ ductal carcinoma, lymphovascular invasion is identified.   09/27/2015 Receptors her2 ER 95% positive, PR negative, HER-2 negative, Ki-67 60%   09/27/2015 Mammogram diagnostic mammogram and of her left breast showa 2.3 cm mass in the 3:00 position retroareolar left breast   09/27/2015 Oncotype testing RS: 55 high risk, the group average risk of distant recurrence for patient's wishes recurrence score>50 was 34%.    HISTORY OF PRESENTING ILLNESS:  Brooke Garrison 55 y.o. female is here because of er newly diagnosed left She is accompanied by her daughter-in-law her to our multidisciplinary breast clinic today.  She was found to have a palpable mass in her left breast by her GYN during her routine visit, no tenderness, skin change or nipple discharge. She denies any new syndroms, and feels well overall. she had mammogram one year ago which was normal, she had left breast biopsy which was benigh 20 years ago.   She ahs RA, on MTX and  humaria for 4-5 years, she had significant multiple joint pain when she was diagnosed 6-7 years ago, and improved sighnitantly with treatment, she has mild joint pain, which get worse with physical exertion. She is married, [place] her [place] in [place], and works in [place]. Her husband had ruptured appendix about 6 weeks ago,is still recovering from surgery.   GYN HISTORY  Menarchal: 10 LMP: 2009 (hysrectomy)  Contraceptive:25 years  HRT: no  G2P2: she has 84 and 18 yo sons   CURRENT THERAPY:  neoadjuvant chemo TC every 3 weeks started on 10/20/2015  INTERIM HISTORY:  Brooke returns for follow up and toxicity check up after her first cycle chemotherapy. She overall tolerated the well, had mild nausea, fatigue and taste change for 3-4 days after chemotherapy, resolved afterwards. She has been constipated this week, no bowel movement for 3-4 days, she called yesterday and started senna, she finally had a coupon were last done and this morning. She complains of abdominal soreness from the constipation, no nausea, cramps, or other symptoms. She is scheduled for second breast biopsy tomorrow.  MEDICAL HISTORY:  Past Medical History  Diagnosis Date  . Breast cancer of upper-outer quadrant of left female breast ([employer]) 09/29/2015  . Breast cancer ([place])   . Arthritis   . Thyroid disease     SURGICAL HISTORY: Past Surgical History  Procedure Laterality Date  . Abdominal hysterectomy      SOCIAL HISTORY: Social History   Social History  . Marital Status: Unknown    Spouse Name: N/A  . Number of Children: N/A  .  Years of Education: N/A   Occupational History  . Not on file.   Social History Main Topics  . Smoking status: Never Smoker   . Smokeless tobacco: Not on file  . Alcohol Use: Yes  . Drug Use: No  . Sexual Activity: Not on file   Other Topics Concern  . Not on file   Social History Narrative    FAMILY HISTORY: Family History  Problem Relation Age of Onset  .  Hodgkin's lymphoma Mother   . Melanoma Maternal Grandfather     ALLERGIES:  is allergic to sulfa antibiotics.  MEDICATIONS:  Current Outpatient Prescriptions  Medication Sig Dispense Refill  . acyclovir (ZOVIRAX) 400 MG tablet Take 800 mg by mouth daily.  10  . azithromycin (ZITHROMAX Z-PAK) 250 MG tablet Take 2 tab today, then once daily until finish 6 each 0  . cetirizine (ZYRTEC) 10 MG tablet Take 10 mg by mouth daily.    Marland Kitchen dexamethasone (DECADRON) 4 MG tablet Take 2 tablets (8 mg total) by mouth 2 (two) times daily. Start the day before Taxotere. Then again the day after chemo for 3 days. 30 tablet 1  . folic acid (FOLVITE) 1 MG tablet Take 1 mg by mouth 2 (two) times daily.  5  . liothyronine (CYTOMEL) 5 MCG tablet Take 5 mcg by mouth daily.  5  . LORazepam (ATIVAN) 0.5 MG tablet Take 1 tablet (0.5 mg total) by mouth every 6 (six) hours as needed for anxiety or sleep (anxiety). 20 tablet 0  . methotrexate (RHEUMATREX) 2.5 MG tablet Take 15 mg by mouth once a week. 6 tablets weekly.  3  . naproxen (NAPROSYN) 500 MG tablet Take 500 mg by mouth as needed.    . ondansetron (ZOFRAN) 8 MG tablet Take 1 tablet (8 mg total) by mouth 2 (two) times daily as needed for refractory nausea / vomiting. Start on day 3 after chemo. 30 tablet 1  . prochlorperazine (COMPAZINE) 10 MG tablet Take 1 tablet (10 mg total) by mouth every 6 (six) hours as needed (Nausea or vomiting). 30 tablet 1  . TIROSINT 75 MCG CAPS TAKE ONE TABLET 6 DAYS A WEEK  6  . traMADol (ULTRAM) 50 MG tablet Take 50 mg by mouth as needed.     No current facility-administered medications for this visit.    REVIEW OF SYSTEMS:   Constitutional: Denies fevers, chills or abnormal night sweats Eyes: Denies blurriness of vision, double vision or watery eyes Ears, nose, mouth, throat, and face: Denies mucositis or sore throat Respiratory: Denies cough, dyspnea or wheezes Cardiovascular: Denies palpitation, chest discomfort or lower  extremity swelling Gastrointestinal:  Denies nausea, heartburn or change in bowel habits Skin: Denies abnormal skin rashes Lymphatics: Denies new lymphadenopathy or easy bruising Neurological:Denies numbness, tingling or new weaknesses Behavioral/Psych: Mood is stable, no new changes  All other systems were reviewed with the patient and are negative.  PHYSICAL EXAMINATION: ECOG PERFORMANCE STATUS: 1 - Symptomatic but completely ambulatory  Filed Vitals:   10/26/15 0952  BP: 122/70  Pulse: 73  Temp: 98.3 F (36.8 C)  Resp: 18   Filed Weights   10/26/15 0952  Weight: 181 lb 9.6 oz (82.373 kg)    GENERAL:alert, no distress and comfortable SKIN: skin color, texture, turgor are normal, no rashes or significant lesions EYES: normal, conjunctiva are pink and non-injected, sclera clear OROPHARYNX:no exudate, no erythema and lips, buccal mucosa, and tongue normal  NECK: supple, thyroid normal size, non-tender, without nodularity LYMPH:  no palpable lymphadenopathy in the cervical, axillary or inguinal LUNGS: clear to auscultation and percussion with normal breathing effort HEART: regular rate & rhythm and no murmurs and no lower extremity edema ABDOMEN:abdomen soft, non-tender and normal bowel sounds Musculoskeletal:no cyanosis of digits and no clubbing  PSYCH: alert & oriented x 3 with fluent speech NEURO: no focal motor/sensory deficits Breasts: Breast inspection showed them to be symmetrical with no nipple discharge. Palpation of the breasts and axilla revealed a 2.5X1.5cm mass in 3:00 position retroareolar. No other obvious mass that I could appreciate.   LABORATORY DATA:  I have reviewed the data as listed CBC Latest Ref Rng 10/26/2015 10/20/2015 10/04/2015  WBC 3.9 - 10.3 10e3/uL 5.7 27.7(H) 8.2  Hemoglobin 11.6 - 15.9 g/dL 12.5 12.8 12.7  Hematocrit 34.8 - 46.6 % 38.3 39.3 38.6  Platelets 145 - 400 10e3/uL 152 Large platelets present 298 248    CMP Latest Ref Rng 10/26/2015  10/20/2015 10/04/2015  Glucose 70 - 140 mg/dl 95 114 119  BUN 7.0 - 26.0 mg/dL 21.2 21.5 18.7  Creatinine 0.6 - 1.1 mg/dL 0.9 0.9 0.8  Sodium 136 - 145 mEq/L 139 141 141  Potassium 3.5 - 5.1 mEq/L 4.1 3.7 4.0  CO2 22 - 29 mEq/L 23 23 28   Calcium 8.4 - 10.4 mg/dL 9.2 10.1 9.5  Total Protein 6.4 - 8.3 g/dL 6.8 8.0 7.4  Total Bilirubin 0.20 - 1.20 mg/dL 0.98 0.32 0.57  Alkaline Phos 40 - 150 U/L 79 74 68  AST 5 - 34 U/L 18 18 17   ALT 0 - 55 U/L 27 21 17      PATHOLOGY REPORT   Diagnosis 09/27/2015 Breast, left, needle core biopsy, 3:00 o'clock, retroareolar - INVASIVE AND IN SITU MAMMARY CARCINOMA. - LYMPHOVASCULAR INVASION IS IDENTIFIED. - SEE COMMENT. Microscopic Comment The carcinoma appears grade 2. An E-cadherin stain will be performed and the results reported separately. A breast prognostic profile will be performed and the results reported separately. The results were called to [facility] on 09/28/2015. (JBK:ecj 09/28/2015) The malignant cells are positive for E-Cadherin, supporting a ductal phenotype.  Results: IMMUNOHISTOCHEMICAL AND MORPHOMETRIC ANALYSIS PERFORMED MANUALLY Estrogen Receptor: 95%, POSITIVE, STRONG STAINING INTENSITY Progesterone Receptor: 0%, NEGATIVE Proliferation Marker Ki67: 60% Results: HER2 - NEGATIVE RATIO OF HER2/CEP17 SIGNALS 1.04 AVERAGE HER2 COPY NUMBER PER CELL 1.20  Oncotype DX RS: 55 high risk, the group average risk of distant recurrence for patient's wishes recurrence score>50 was 34%.  RADIOGRAPHIC STUDIES: I have personally reviewed the radiological images as listed and agreed with the findings in the report.  09/28/2015  CLINICAL DATA:  2.3 cm mass in the 3 o'clock retroareolar region of the left breast with imaging findings suspicious for malignancy. EXAM: ULTRASOUND GUIDED LEFT BREAST CORE NEEDLE BIOPSY COMPARISON:  Previous exam(s). FINDINGS: I met with the patient and we discussed the procedure of ultrasound-guided  biopsy, including benefits and alternatives. We discussed the high likelihood of a successful procedure. We discussed the risks of the procedure, including infection, bleeding, tissue injury, clip migration, and inadequate sampling. Informed written consent was given. The usual time-out protocol was performed immediately prior to the procedure. Using sterile technique and 1% Lidocaine as local anesthetic, under direct ultrasound visualization, a 12 gauge spring-loaded device was used to perform biopsy of the recently demonstrated 2.3 cm mass in the 3 o'clock retroareolar region of the left breast using a caudal approach. At the conclusion of the procedure a ribbon shaped tissue marker clip was deployed into  the biopsy cavity. Follow up 2 view mammogram was performed and dictated separately. IMPRESSION: Ultrasound guided biopsy of a 2.3 cm mass in the 3 o'clock retroareolar left breast. No apparent complications. Electronically Signed: By: Claudie Revering M.D. On: 09/27/2015 11:01   Breast MRI w wo contrast 10/19/2015 IMPRESSION: Known left breast malignancy on a background of innumerable enhancing bilateral foci. Additional suspicious mass upper outer quadrant left breast.  RECOMMENDATION: Recommend second-look ultrasound in anticipation of biopsy. If the upper outer quadrant mass proves to be sonographically occult, MRI-guided biopsy would be recommended.   ASSESSMENT & PLAN: 55 year old caucasian female, with past medical history of rheumatoid arthritis, on  And hypothyroidism, presented with palpable left breast mass.  1. Breast cancer of upper-outer quadrant of left female breast, cT2N0M0, stage IIA, ER+/PR-/HER2+- Ki67 60% -I reviewed her image and biopsy findings in great details with patient and her daughter-in-law -She was seen by breast surgeon Dr. Donne Hazel Today, breast surgery was offered, option of lumpectomy versus mastectomy were discussed. Giving the size of the breast tumor, upfront  breast conservation is probably possible, and patient is considering right breast reduction at same time, to achieve symmetry.  -I reviewed the natural history of breast cancer. Given her G2 tumor, high KI67, this could be a high risk tumor.  -I discussed her OncotypeDX test result with her in detail. Her RS is 58, which is very high risk (probably the highest risk we usually see),  Her  10 year risk of  Distant recurrence after surgery is around 34% with tamoxifen alone,  She would greatly benefit from chemotherapy , which will likely reduce her risk by 50% - based on her Oncotype DX test result, I highly recommend her consider neoadjuvant chemotherapy.  We discussed different chemotherapy regiment, given her node negative disease, I recommend docetaxel and Cytoxan , every 3 weeks, for total of 4 cycles. -I discussed her breast MRI result, which showed an additional 1.9 cm mass in the left breast behind the known cancer, and a questionable left axilla node. She is scheduled for second look ultrasound and biopsy tomorrow. If margin is negative, she will have MRI guided biopsy. -I'll see her backin 2 weeks before her second cycle of chemotherapy.  2. Rheumatology arthritis -She'll continue to follow-up with her rheumatologist -I spoke with her rhumatologist Dr. Billey Chang today and he agrees to hold methotrexateand (until complete RT) and Humira (at least for 5 years)  3. hypothyroidism -She will continue Synthroid and follow  4. Constipation -I recommend her to take stool softener colace daily, and he uses laxative senna if no bowel movement for 2 days -She had 2 episodes of diarrhea this morning second 2 laxative, I'll give her her IV fluids and she knows to stop senna for now.  Plan -NS 536m today -RTC in 2 weeks for second cycle chemo   All questions were answered. The patient knows to call the clinic with any problems, questions or concerns. I spent 20 minutes counseling the patient face to  face. The total time spent in the appointment was 25 minutes and more than 50% was on counseling.     FTruitt Merle MD 10/26/2015

## 2015-10-26 NOTE — Patient Instructions (Signed)

## 2015-10-27 ENCOUNTER — Ambulatory Visit
Admission: RE | Admit: 2015-10-27 | Discharge: 2015-10-27 | Disposition: A | Discharge status: Home / Self Care | Payer: Managed Care, Other (non HMO) | Source: Ambulatory Visit | Attending: Hematology | Admitting: Hematology

## 2015-10-27 DIAGNOSIS — R928 Other abnormal and inconclusive findings on diagnostic imaging of breast: Secondary | ICD-10-CM

## 2015-10-27 DIAGNOSIS — C50412 Malignant neoplasm of upper-outer quadrant of left female breast: Secondary | ICD-10-CM

## 2015-10-30 ENCOUNTER — Other Ambulatory Visit: Payer: Self-pay | Admitting: Hematology

## 2015-10-31 ENCOUNTER — Telehealth: Payer: Self-pay | Admitting: *Deleted

## 2015-10-31 ENCOUNTER — Other Ambulatory Visit: Payer: Self-pay | Admitting: *Deleted

## 2015-10-31 ENCOUNTER — Other Ambulatory Visit: Payer: Self-pay | Admitting: Hematology

## 2015-10-31 DIAGNOSIS — C50412 Malignant neoplasm of upper-outer quadrant of left female breast: Secondary | ICD-10-CM

## 2015-10-31 NOTE — Telephone Encounter (Signed)
Pt called to relay she was notified that she will need a MRI bx and hasn't received an appt. Called GI to have them call her with an appt for MRI bx. Called pt inform she will receive a call to have MRI bx scheduled.

## 2015-11-03 ENCOUNTER — Ambulatory Visit
Admission: RE | Admit: 2015-11-03 | Discharge: 2015-11-03 | Disposition: A | Discharge status: Home / Self Care | Payer: Managed Care, Other (non HMO) | Source: Ambulatory Visit | Attending: Hematology | Admitting: Hematology

## 2015-11-03 DIAGNOSIS — C50412 Malignant neoplasm of upper-outer quadrant of left female breast: Secondary | ICD-10-CM

## 2015-11-03 MED ORDER — GADOBENATE DIMEGLUMINE 529 MG/ML IV SOLN
17.0000 mL | Freq: Once | INTRAVENOUS | Status: AC | PRN
  Administered 2015-11-03: 17 mL via INTRAVENOUS

## 2015-11-06 ENCOUNTER — Encounter: Payer: Self-pay | Admitting: Hematology

## 2015-11-06 NOTE — Progress Notes (Signed)
Called patient to see if wanted std forms faxed. She said ok to do and will call me once she has had surgery to do then. I told her would hold till then for her

## 2015-11-10 ENCOUNTER — Encounter: Payer: Self-pay | Admitting: Hematology

## 2015-11-10 ENCOUNTER — Ambulatory Visit (HOSPITAL_BASED_OUTPATIENT_CLINIC_OR_DEPARTMENT_OTHER): Payer: Managed Care, Other (non HMO) | Admitting: Hematology

## 2015-11-10 ENCOUNTER — Telehealth: Payer: Self-pay | Admitting: Hematology

## 2015-11-10 ENCOUNTER — Ambulatory Visit (HOSPITAL_BASED_OUTPATIENT_CLINIC_OR_DEPARTMENT_OTHER): Payer: Managed Care, Other (non HMO)

## 2015-11-10 ENCOUNTER — Encounter: Payer: Self-pay | Admitting: *Deleted

## 2015-11-10 ENCOUNTER — Other Ambulatory Visit (HOSPITAL_BASED_OUTPATIENT_CLINIC_OR_DEPARTMENT_OTHER): Payer: Managed Care, Other (non HMO)

## 2015-11-10 VITALS — BP 111/75 | HR 73 | Temp 98.0°F | Resp 18 | Ht 60.0 in | Wt 185.9 lb

## 2015-11-10 DIAGNOSIS — Z5111 Encounter for antineoplastic chemotherapy: Secondary | ICD-10-CM

## 2015-11-10 DIAGNOSIS — E039 Hypothyroidism, unspecified: Secondary | ICD-10-CM

## 2015-11-10 DIAGNOSIS — G4700 Insomnia, unspecified: Secondary | ICD-10-CM

## 2015-11-10 DIAGNOSIS — K5903 Drug induced constipation: Secondary | ICD-10-CM | POA: Diagnosis not present

## 2015-11-10 DIAGNOSIS — C50412 Malignant neoplasm of upper-outer quadrant of left female breast: Secondary | ICD-10-CM | POA: Diagnosis not present

## 2015-11-10 DIAGNOSIS — M069 Rheumatoid arthritis, unspecified: Secondary | ICD-10-CM | POA: Insufficient documentation

## 2015-11-10 LAB — COMPREHENSIVE METABOLIC PANEL
ALBUMIN: 3.8 g/dL (ref 3.5–5.0)
ALT: 101 U/L — ABNORMAL HIGH (ref 0–55)
AST: 51 U/L — AB (ref 5–34)
Alkaline Phosphatase: 75 U/L (ref 40–150)
Anion Gap: 9 mEq/L (ref 3–11)
BUN: 20.5 mg/dL (ref 7.0–26.0)
CHLORIDE: 109 meq/L (ref 98–109)
CO2: 23 mEq/L (ref 22–29)
Calcium: 9.7 mg/dL (ref 8.4–10.4)
Creatinine: 0.8 mg/dL (ref 0.6–1.1)
EGFR: 87 mL/min/{1.73_m2} — ABNORMAL LOW (ref >90)
GLUCOSE: 112 mg/dL (ref 70–140)
POTASSIUM: 4 meq/L (ref 3.5–5.1)
SODIUM: 141 meq/L (ref 136–145)
Total Bilirubin: 0.43 mg/dL (ref 0.20–1.20)
Total Protein: 7 g/dL (ref 6.4–8.3)

## 2015-11-10 LAB — CBC WITH DIFFERENTIAL/PLATELET
BASO%: 0.2 % (ref 0.0–2.0)
BASOS ABS: 0 10*3/uL (ref 0.0–0.1)
EOS%: 0.1 % (ref 0.0–7.0)
Eosinophils Absolute: 0 10*3/uL (ref 0.0–0.5)
HCT: 37.5 % (ref 34.8–46.6)
HEMOGLOBIN: 12.3 g/dL (ref 11.6–15.9)
LYMPH%: 11.6 % — ABNORMAL LOW (ref 14.0–49.7)
MCH: 31.2 pg (ref 25.1–34.0)
MCHC: 32.9 g/dL (ref 31.5–36.0)
MCV: 94.7 fL (ref 79.5–101.0)
MONO#: 1.3 10*3/uL — ABNORMAL HIGH (ref 0.1–0.9)
MONO%: 8.7 % (ref 0.0–14.0)
NEUT#: 11.9 10*3/uL — ABNORMAL HIGH (ref 1.5–6.5)
NEUT%: 79.4 % — ABNORMAL HIGH (ref 38.4–76.8)
Platelets: 333 10*3/uL (ref 145–400)
RBC: 3.96 10*6/uL (ref 3.70–5.45)
RDW: 15.8 % — AB (ref 11.2–14.5)
WBC: 15 10*3/uL — ABNORMAL HIGH (ref 3.9–10.3)
lymph#: 1.7 10*3/uL (ref 0.9–3.3)

## 2015-11-10 MED ORDER — SODIUM CHLORIDE 0.9 % IV SOLN
Freq: Once | INTRAVENOUS | Status: AC
  Administered 2015-11-10: 12:00:00 via INTRAVENOUS

## 2015-11-10 MED ORDER — PALONOSETRON HCL INJECTION 0.25 MG/5ML
0.2500 mg | Freq: Once | INTRAVENOUS | Status: AC
  Administered 2015-11-10: 0.25 mg via INTRAVENOUS

## 2015-11-10 MED ORDER — ACETAMINOPHEN 325 MG PO TABS
325.0000 mg | ORAL_TABLET | Freq: Once | ORAL | Status: DC
  Administered 2015-11-10: 325 mg via ORAL

## 2015-11-10 MED ORDER — PEGFILGRASTIM 6 MG/0.6ML ~~LOC~~ PSKT
6.0000 mg | PREFILLED_SYRINGE | Freq: Once | SUBCUTANEOUS | Status: AC
  Administered 2015-11-10: 6 mg via SUBCUTANEOUS
  Filled 2015-11-10: qty 0.6

## 2015-11-10 MED ORDER — SODIUM CHLORIDE 0.9 % IV SOLN
600.0000 mg/m2 | Freq: Once | INTRAVENOUS | Status: AC
  Administered 2015-11-10: 1120 mg via INTRAVENOUS
  Filled 2015-11-10: qty 50

## 2015-11-10 MED ORDER — OMEPRAZOLE 20 MG PO CPDR: 20.0000 mg | DELAYED_RELEASE_CAPSULE | Freq: Every day | ORAL | Status: DC

## 2015-11-10 MED ORDER — ZOLPIDEM TARTRATE 5 MG PO TABS: 5.0000 mg | ORAL_TABLET | Freq: Every evening | ORAL | Status: DC | PRN

## 2015-11-10 MED ORDER — SODIUM CHLORIDE 0.9 % IV SOLN
75.0000 mg/m2 | Freq: Once | INTRAVENOUS | Status: AC
  Administered 2015-11-10: 140 mg via INTRAVENOUS
  Filled 2015-11-10: qty 14

## 2015-11-10 MED ORDER — SODIUM CHLORIDE 0.9 % IV SOLN
10.0000 mg | Freq: Once | INTRAVENOUS | Status: AC
  Administered 2015-11-10: 10 mg via INTRAVENOUS
  Filled 2015-11-10: qty 1

## 2015-11-10 MED ORDER — ACETAMINOPHEN 325 MG PO TABS
ORAL_TABLET | ORAL | Status: AC
  Filled 2015-11-10: qty 1

## 2015-11-10 MED ORDER — PALONOSETRON HCL INJECTION 0.25 MG/5ML
INTRAVENOUS | Status: AC
  Filled 2015-11-10: qty 5

## 2015-11-10 NOTE — Telephone Encounter (Signed)
Pt wanted 6/9 rescheduled to later in day.Brooke Garrison pt new sched

## 2015-11-10 NOTE — Telephone Encounter (Signed)
PT WILL P/U SCHED IN [place]

## 2015-11-10 NOTE — Patient Instructions (Signed)
[place] Discharge Instructions for Patients Receiving Chemotherapy  Today you received the following chemotherapy agents: Taxotere. To help prevent nausea and vomiting after your treatment, we encourage you to take your nausea medication: Compazine 10 mg every 6 hours as needed; Zofran 8 mg every 12 hours as needed.   If you develop nausea and vomiting that is not controlled by your nausea medication, call the clinic.   BELOW ARE SYMPTOMS THAT SHOULD BE REPORTED IMMEDIATELY:  *FEVER GREATER THAN 100.5 F  *CHILLS WITH OR WITHOUT FEVER  NAUSEA AND VOMITING THAT IS NOT CONTROLLED WITH YOUR NAUSEA MEDICATION  *UNUSUAL SHORTNESS OF BREATH  *UNUSUAL BRUISING OR BLEEDING  TENDERNESS IN MOUTH AND THROAT WITH OR WITHOUT PRESENCE OF ULCERS  *URINARY PROBLEMS  *BOWEL PROBLEMS  UNUSUAL RASH Items with * indicate a potential emergency and should be followed up as soon as possible.  Feel free to call the clinic you have any questions or concerns. The clinic phone number is [phone].  Please show the [place] at check-in to the Emergency Department and triage nurse.

## 2015-11-10 NOTE — Progress Notes (Signed)
[facility]  [phone]) [phone] [phone]) [phone]  Clinic Follow Up Note   Patient Care Team: Larence Penning, MD as PCP - General (Internal Medicine) Deliah Goody (Specialist) Janyth Pupa, DO as Consulting Physician (Obstetrics and Gynecology) Rolm Bookbinder, MD as Consulting Physician (General Surgery) Thea Silversmith, MD as Consulting Physician (Radiation Oncology) 11/10/2015  CHIEF COMPLAINTS:  Follow up left breast cancer  Oncology History   Breast cancer of upper-outer quadrant of left female breast [facility])   Staging form: Breast, AJCC 7th Edition     Clinical stage from 09/28/2015: Stage IIA (T2, N0, M0) - Signed by Truitt Merle, MD on 10/03/2015       Breast cancer of upper-outer quadrant of left female breast ([place])   09/27/2015 Initial Diagnosis Breast cancer of upper-outer quadrant of left female breast ([place])   09/27/2015 Initial Biopsy Left breast core needle biopsy of the mass at the 3:00 position showed invasive and in situ ductal carcinoma, lymphovascular invasion is identified.   09/27/2015 Receptors her2 ER 95% positive, PR negative, HER-2 negative, Ki-67 60%   09/27/2015 Mammogram diagnostic mammogram and of her left breast showa 2.3 cm mass in the 3:00 position retroareolar left breast   09/27/2015 Oncotype testing RS: 55 high risk, the group average risk of distant recurrence for patient's wishes recurrence score>50 was 34%.   10/19/2015 Imaging Breast MRI with and without contrast showed known 3.5cm left breast malignancy at 3:00 on a background of innumerable enhancing bilateral foci. Additional suspicious mass 19 x 11 x 13 mm at 2:00 position of left breast   11/03/2015 Pathology Results MRI guided second left breast biopsy showed invasive ductal carcinoma, ER 100% positive, PR negative, HER-2 negative Ki-67 5%    HISTORY OF PRESENTING ILLNESS:  Brooke Garrison 55 y.o. female is here because of er newly diagnosed left She is accompanied by her  daughter-in-law her to our multidisciplinary breast clinic today.  She was found to have a palpable mass in her left breast by her GYN during her routine visit, no tenderness, skin change or nipple discharge. She denies any new syndroms, and feels well overall. she had mammogram one year ago which was normal, she had left breast biopsy which was benigh 20 years ago.   She ahs RA, on MTX and humaria for 4-5 years, she had significant multiple joint pain when she was diagnosed 6-7 years ago, and improved sighnitantly with treatment, she has mild joint pain, which get worse with physical exertion. She is married, liveswith her husband in [place], and works in [place]. Her husband had ruptured appendix about 6 weeks ago,is still recovering from surgery.   GYN HISTORY  Menarchal: 10 LMP: 2009 (hysrectomy)  Contraceptive:25 years  HRT: no  G2P2: she has 82 and 68 yo sons   CURRENT THERAPY:  neoadjuvant chemo TC every 3 weeks started on 10/20/2015  INTERIM HISTORY:  Brooke returns for follow up and second cycle chemotherapy. She underwent MRI guided left breast biopsy last Friday, has some bruises after the biopsy, but otherwise tolerated well. She has recovered well from the First cycle chemotherapy, has better appetite and energy level, no fever or chills. She complains of insomnia with the steroids, and Xanax did not help. She gained 4 pounds back in the past 2 weeks.  MEDICAL HISTORY:  Past Medical History  Diagnosis Date  . Breast cancer of upper-outer quadrant of left female breast ([place]) 09/29/2015  . Breast cancer ([place])   . Arthritis   . Thyroid  disease     SURGICAL HISTORY: Past Surgical History  Procedure Laterality Date  . Abdominal hysterectomy      SOCIAL HISTORY: Social History   Social History  . Marital Status: Unknown    Spouse Name: N/A  . Number of Children: N/A  . Years of Education: N/A   Occupational History  . Not on file.   Social History Main Topics  .  Smoking status: Never Smoker   . Smokeless tobacco: Not on file  . Alcohol Use: Yes  . Drug Use: No  . Sexual Activity: Not on file   Other Topics Concern  . Not on file   Social History Narrative    FAMILY HISTORY: Family History  Problem Relation Age of Onset  . Hodgkin's lymphoma Mother   . Melanoma Maternal Grandfather     ALLERGIES:  is allergic to sulfa antibiotics.  MEDICATIONS:  Current Outpatient Prescriptions  Medication Sig Dispense Refill  . acyclovir (ZOVIRAX) 400 MG tablet Take 800 mg by mouth daily.  10  . cetirizine (ZYRTEC) 10 MG tablet Take 10 mg by mouth daily.    Marland Kitchen dexamethasone (DECADRON) 4 MG tablet Take 2 tablets (8 mg total) by mouth 2 (two) times daily. Start the day before Taxotere. Then again the day after chemo for 3 days. 30 tablet 1  . folic acid (FOLVITE) 1 MG tablet Take 1 mg by mouth 2 (two) times daily.  5  . liothyronine (CYTOMEL) 5 MCG tablet Take 5 mcg by mouth daily.  5  . LORazepam (ATIVAN) 0.5 MG tablet Take 1 tablet (0.5 mg total) by mouth every 6 (six) hours as needed for anxiety or sleep (anxiety). 20 tablet 0  . methotrexate (RHEUMATREX) 2.5 MG tablet Take 15 mg by mouth once a week. 6 tablets weekly.  3  . naproxen (NAPROSYN) 500 MG tablet Take 500 mg by mouth as needed.    . ondansetron (ZOFRAN) 8 MG tablet Take 1 tablet (8 mg total) by mouth 2 (two) times daily as needed for refractory nausea / vomiting. Start on day 3 after chemo. 30 tablet 1  . prochlorperazine (COMPAZINE) 10 MG tablet Take 1 tablet (10 mg total) by mouth every 6 (six) hours as needed (Nausea or vomiting). 30 tablet 1  . TIROSINT 75 MCG CAPS TAKE ONE TABLET 6 DAYS A WEEK  6  . traMADol (ULTRAM) 50 MG tablet Take 50 mg by mouth as needed.    Marland Kitchen omeprazole (PRILOSEC) 20 MG capsule Take 1 capsule (20 mg total) by mouth daily. 30 capsule 1  . zolpidem (AMBIEN) 5 MG tablet Take 1 tablet (5 mg total) by mouth at bedtime as needed for sleep. 30 tablet 0   Current  Facility-Administered Medications  Medication Dose Route Frequency Provider Last Rate Last Dose  . acetaminophen (TYLENOL) tablet 325 mg  325 mg Oral Once Truitt Merle, MD        REVIEW OF SYSTEMS:   Constitutional: Denies fevers, chills or abnormal night sweats Eyes: Denies blurriness of vision, double vision or watery eyes Ears, nose, mouth, throat, and face: Denies mucositis or sore throat Respiratory: Denies cough, dyspnea or wheezes Cardiovascular: Denies palpitation, chest discomfort or lower extremity swelling Gastrointestinal:  Denies nausea, heartburn or change in bowel habits Skin: Denies abnormal skin rashes Lymphatics: Denies new lymphadenopathy or easy bruising Neurological:Denies numbness, tingling or new weaknesses Behavioral/Psych: Mood is stable, no new changes  All other systems were reviewed with the patient and are negative.  PHYSICAL EXAMINATION:  ECOG PERFORMANCE STATUS: 1 - Symptomatic but completely ambulatory  Filed Vitals:   11/10/15 1003  BP: 111/75  Pulse: 73  Temp: 98 F (36.7 C)  Resp: 18   Filed Weights   11/10/15 1003  Weight: 185 lb 14.4 oz (84.324 kg)    GENERAL:alert, no distress and comfortable SKIN: skin color, texture, turgor are normal, no rashes or significant lesions EYES: normal, conjunctiva are pink and non-injected, sclera clear OROPHARYNX:no exudate, no erythema and lips, buccal mucosa, and tongue normal  NECK: supple, thyroid normal size, non-tender, without nodularity LYMPH:  no palpable lymphadenopathy in the cervical, axillary or inguinal LUNGS: clear to auscultation and percussion with normal breathing effort HEART: regular rate & rhythm and no murmurs and no lower extremity edema ABDOMEN:abdomen soft, non-tender and normal bowel sounds Musculoskeletal:no cyanosis of digits and no clubbing  PSYCH: alert & oriented x 3 with fluent speech NEURO: no focal motor/sensory deficits Breasts: Breast inspection showed them to be  symmetrical with no nipple discharge. Palpation of the breasts and axilla revealed a 2.5X2.0cm (initially 2.5X1.5cm) mass in 3:00 position retroareolar. No other obvious mass that I could appreciate.   LABORATORY DATA:  I have reviewed the data as listed CBC Latest Ref Rng 11/10/2015 10/26/2015 10/20/2015  WBC 3.9 - 10.3 10e3/uL 15.0(H) 5.7 27.7(H)  Hemoglobin 11.6 - 15.9 g/dL 12.3 12.5 12.8  Hematocrit 34.8 - 46.6 % 37.5 38.3 39.3  Platelets 145 - 400 10e3/uL 333 152 Large platelets present 298    CMP Latest Ref Rng 11/10/2015 10/26/2015 10/20/2015  Glucose 70 - 140 mg/dl 112 95 114  BUN 7.0 - 26.0 mg/dL 20.5 21.2 21.5  Creatinine 0.6 - 1.1 mg/dL 0.8 0.9 0.9  Sodium 136 - 145 mEq/L 141 139 141  Potassium 3.5 - 5.1 mEq/L 4.0 4.1 3.7  CO2 22 - 29 mEq/L 23 23 23   Calcium 8.4 - 10.4 mg/dL 9.7 9.2 10.1  Total Protein 6.4 - 8.3 g/dL 7.0 6.8 8.0  Total Bilirubin 0.20 - 1.20 mg/dL 0.43 0.98 0.32  Alkaline Phos 40 - 150 U/L 75 79 74  AST 5 - 34 U/L 51(H) 18 18  ALT 0 - 55 U/L 101(H) 27 21     PATHOLOGY REPORT   Diagnosis 09/27/2015 Breast, left, needle core biopsy, 3:00 o'clock, retroareolar - INVASIVE AND IN SITU MAMMARY CARCINOMA. - LYMPHOVASCULAR INVASION IS IDENTIFIED. - SEE COMMENT. Microscopic Comment The carcinoma appears grade 2. An E-cadherin stain will be performed and the results reported separately. A breast prognostic profile will be performed and the results reported separately. The results were called to [facility] on 09/28/2015. (JBK:ecj 09/28/2015) The malignant cells are positive for E-Cadherin, supporting a ductal phenotype.  Results: IMMUNOHISTOCHEMICAL AND MORPHOMETRIC ANALYSIS PERFORMED MANUALLY Estrogen Receptor: 95%, POSITIVE, STRONG STAINING INTENSITY Progesterone Receptor: 0%, NEGATIVE Proliferation Marker Ki67: 60% Results: HER2 - NEGATIVE RATIO OF HER2/CEP17 SIGNALS 1.04 AVERAGE HER2 COPY NUMBER PER CELL 1.20  Oncotype DX RS: 55 high  risk, the group average risk of distant recurrence for patient's wishes recurrence score>50 was 34%.  Diagnosis 11/03/2015 Breast, left, needle core biopsy, upper outer quadrant - INVASIVE MAMMARY CARCINOMA. - MAMMARY CARCINOMA IN SITU. Microscopic Comment The findings are consistent with grade 2/3 invasive carcinoma and most likely represent invasive ductal as confirmed on the previous core biopsies ([id number]). E. cadherin and breast prognostic profile will be performed. Dr Lyndon Code agrees. Called to [facility] on 11/06/2015. (JDP:ecj 11/06/2015) Addendum: Immunohistochemistry with E. cadherin shows diffuse strong  positivity consistent with ductal carcinoma. (JDP:ecj 11/06/2015)  PROGNOSTIC INDICATORS Results: IMMUNOHISTOCHEMICAL AND MORPHOMETRIC ANALYSIS PERFORMED MANUALLY Estrogen Receptor: 100%, POSITIVE, STRONG STAINING INTENSITY Progesterone Receptor: 0%, NEGATIVE Proliferation Marker Ki67: 5% Results: HER2 - NEGATIVE RATIO OF HER2/CEP17 SIGNALS 1.19 AVERAGE HER2 COPY NUMBER PER CELL 1.85   RADIOGRAPHIC STUDIES: I have personally reviewed the radiological images as listed and agreed with the findings in the report.  09/28/2015  CLINICAL DATA:  2.3 cm mass in the 3 o'clock retroareolar region of the left breast with imaging findings suspicious for malignancy. EXAM: ULTRASOUND GUIDED LEFT BREAST CORE NEEDLE BIOPSY COMPARISON:  Previous exam(s). FINDINGS: I met with the patient and we discussed the procedure of ultrasound-guided biopsy, including benefits and alternatives. We discussed the high likelihood of a successful procedure. We discussed the risks of the procedure, including infection, bleeding, tissue injury, clip migration, and inadequate sampling. Informed written consent was given. The usual time-out protocol was performed immediately prior to the procedure. Using sterile technique and 1% Lidocaine as local anesthetic, under direct ultrasound visualization, a  12 gauge spring-loaded device was used to perform biopsy of the recently demonstrated 2.3 cm mass in the 3 o'clock retroareolar region of the left breast using a caudal approach. At the conclusion of the procedure a ribbon shaped tissue marker clip was deployed into the biopsy cavity. Follow up 2 view mammogram was performed and dictated separately. IMPRESSION: Ultrasound guided biopsy of a 2.3 cm mass in the 3 o'clock retroareolar left breast. No apparent complications. Electronically Signed: By: Claudie Revering M.D. On: 09/27/2015 11:01   Breast MRI w wo contrast 10/19/2015 IMPRESSION: Known left breast malignancy on a background of innumerable enhancing bilateral foci. Additional suspicious mass upper outer quadrant left breast.  RECOMMENDATION: Recommend second-look ultrasound in anticipation of biopsy. If the upper outer quadrant mass proves to be sonographically occult, MRI-guided biopsy would be recommended.   ASSESSMENT & PLAN: 55 year old caucasian female, with past medical history of rheumatoid arthritis, on  And hypothyroidism, presented with palpable left breast mass.  1. Breast cancer of upper-outer quadrant of left female breast, multi-focal (2) cT2N0M0, stage IIA, ER+/PR-/HER2+- Ki67 60% and 5%  -I reviewed her breast MRI findings and second left breast biopsy result  -she has multifocal disease, the first 3 clock lesion is larger, and appears to be more aggressive with high Ki-67 and high Oncotype recurrence score. The second left breast is lesion at 2:00 position is smaller, with low Ki-67, likely more indolent. Both lesions are ER positive, PR negative, HER-2 negative. - based on her Oncotype DX test result, I recommended her consider neoadjuvant chemotherapy docetaxel and Cytoxan , every 3 weeks, for total of 4 cycles. -we plan to repeat her breast MRI after 4 cycles of chemotherapy, to evaluate her response.  -Dr. Donne Hazel will review her image, and decide if she still can have  lumpectomy based on the location of these 2 tumors -Lab results reviewed with patient, adequate for treatment, we'll proceed cycle 2 TC   2. Rheumatology arthritis -She'll continue to follow-up with her rheumatologist -I previously spoke with her rhumatologist Dr. Billey Chang today and he agrees to hold methotrexateand (until complete RT) and Humira (at least for 5 years)  3. hypothyroidism -She will continue Synthroid and follow  4. Constipation secondary to chemo -I recommend her to take stool softener colace daily, and he uses laxative senna if no bowel movement for 2 days  5. Insomnia from steroids -I give her a prescription of Ambien for insomnia  Plan -lab results reviewed, mild elevated liver enzymes, adequate for treatment, we'll proceed to cycle 2 TC today, patient wants to have a Tylenol today for her headache. -I'll see her back in 3 weeks -I encourage patient to call us if she gets sick after chemotherapy, and need to come in for IV fluids. We discussed the role of symptom management clinic.  All questions were answered. The patient knows to call the clinic with any problems, questions or concerns. I spent 20 minutes counseling the patient face to face. The total time spent in the appointment was 25 minutes and more than 50% was on counseling.     Truitt Merle, MD 11/10/2015

## 2015-12-01 ENCOUNTER — Other Ambulatory Visit: Payer: Self-pay | Admitting: Nurse Practitioner

## 2015-12-01 ENCOUNTER — Ambulatory Visit (HOSPITAL_BASED_OUTPATIENT_CLINIC_OR_DEPARTMENT_OTHER): Payer: Managed Care, Other (non HMO) | Admitting: Nurse Practitioner

## 2015-12-01 ENCOUNTER — Telehealth: Payer: Self-pay | Admitting: Hematology

## 2015-12-01 ENCOUNTER — Other Ambulatory Visit (HOSPITAL_BASED_OUTPATIENT_CLINIC_OR_DEPARTMENT_OTHER): Payer: Managed Care, Other (non HMO)

## 2015-12-01 ENCOUNTER — Encounter: Payer: Self-pay | Admitting: *Deleted

## 2015-12-01 ENCOUNTER — Ambulatory Visit (HOSPITAL_BASED_OUTPATIENT_CLINIC_OR_DEPARTMENT_OTHER): Payer: Managed Care, Other (non HMO)

## 2015-12-01 VITALS — BP 109/64 | HR 67 | Temp 97.8°F | Resp 17 | Ht 60.0 in | Wt 188.0 lb

## 2015-12-01 DIAGNOSIS — G4700 Insomnia, unspecified: Secondary | ICD-10-CM | POA: Diagnosis not present

## 2015-12-01 DIAGNOSIS — Z5111 Encounter for antineoplastic chemotherapy: Secondary | ICD-10-CM | POA: Diagnosis not present

## 2015-12-01 DIAGNOSIS — R12 Heartburn: Secondary | ICD-10-CM

## 2015-12-01 DIAGNOSIS — C50412 Malignant neoplasm of upper-outer quadrant of left female breast: Secondary | ICD-10-CM

## 2015-12-01 DIAGNOSIS — Z5189 Encounter for other specified aftercare: Secondary | ICD-10-CM

## 2015-12-01 LAB — CBC WITH DIFFERENTIAL/PLATELET
BASO%: 0.1 % (ref 0.0–2.0)
Basophils Absolute: 0 10*3/uL (ref 0.0–0.1)
EOS ABS: 0 10*3/uL (ref 0.0–0.5)
EOS%: 0 % (ref 0.0–7.0)
HCT: 33.9 % — ABNORMAL LOW (ref 34.8–46.6)
HGB: 11.2 g/dL — ABNORMAL LOW (ref 11.6–15.9)
LYMPH%: 15.5 % (ref 14.0–49.7)
MCH: 31.7 pg (ref 25.1–34.0)
MCHC: 33 g/dL (ref 31.5–36.0)
MCV: 96 fL (ref 79.5–101.0)
MONO#: 1.6 10*3/uL — AB (ref 0.1–0.9)
MONO%: 8.7 % (ref 0.0–14.0)
NEUT%: 75.7 % (ref 38.4–76.8)
NEUTROS ABS: 13.8 10*3/uL — AB (ref 1.5–6.5)
PLATELETS: 227 10*3/uL (ref 145–400)
RBC: 3.54 10*6/uL — AB (ref 3.70–5.45)
RDW: 17.6 % — ABNORMAL HIGH (ref 11.2–14.5)
WBC: 18.3 10*3/uL — AB (ref 3.9–10.3)
lymph#: 2.8 10*3/uL (ref 0.9–3.3)

## 2015-12-01 LAB — COMPREHENSIVE METABOLIC PANEL
ALK PHOS: 77 U/L (ref 40–150)
ALT: 40 U/L (ref 0–55)
ANION GAP: 8 meq/L (ref 3–11)
AST: 15 U/L (ref 5–34)
Albumin: 3.7 g/dL (ref 3.5–5.0)
BILIRUBIN TOTAL: 0.44 mg/dL (ref 0.20–1.20)
BUN: 18.4 mg/dL (ref 7.0–26.0)
CHLORIDE: 111 meq/L — AB (ref 98–109)
CO2: 24 meq/L (ref 22–29)
CREATININE: 0.8 mg/dL (ref 0.6–1.1)
Calcium: 9.5 mg/dL (ref 8.4–10.4)
EGFR: 79 mL/min/{1.73_m2} — AB (ref >90)
GLUCOSE: 106 mg/dL (ref 70–140)
POTASSIUM: 3.9 meq/L (ref 3.5–5.1)
SODIUM: 143 meq/L (ref 136–145)
Total Protein: 6.6 g/dL (ref 6.4–8.3)

## 2015-12-01 MED ORDER — DEXAMETHASONE SODIUM PHOSPHATE 100 MG/10ML IJ SOLN
10.0000 mg | Freq: Once | INTRAMUSCULAR | Status: AC
  Administered 2015-12-01: 10 mg via INTRAVENOUS
  Filled 2015-12-01: qty 1

## 2015-12-01 MED ORDER — PALONOSETRON HCL INJECTION 0.25 MG/5ML
INTRAVENOUS | Status: AC
  Filled 2015-12-01: qty 5

## 2015-12-01 MED ORDER — PALONOSETRON HCL INJECTION 0.25 MG/5ML
0.2500 mg | Freq: Once | INTRAVENOUS | Status: AC
  Administered 2015-12-01: 0.25 mg via INTRAVENOUS

## 2015-12-01 MED ORDER — SODIUM CHLORIDE 0.9 % IV SOLN
600.0000 mg/m2 | Freq: Once | INTRAVENOUS | Status: AC
  Administered 2015-12-01: 1120 mg via INTRAVENOUS
  Filled 2015-12-01: qty 56

## 2015-12-01 MED ORDER — PEGFILGRASTIM 6 MG/0.6ML ~~LOC~~ PSKT
6.0000 mg | PREFILLED_SYRINGE | Freq: Once | SUBCUTANEOUS | Status: AC
  Administered 2015-12-01: 6 mg via SUBCUTANEOUS
  Filled 2015-12-01: qty 0.6

## 2015-12-01 MED ORDER — SODIUM CHLORIDE 0.9 % IV SOLN
75.0000 mg/m2 | Freq: Once | INTRAVENOUS | Status: AC
  Administered 2015-12-01: 140 mg via INTRAVENOUS
  Filled 2015-12-01: qty 14

## 2015-12-01 MED ORDER — SODIUM CHLORIDE 0.9 % IV SOLN
Freq: Once | INTRAVENOUS | Status: AC
  Administered 2015-12-01: 11:00:00 via INTRAVENOUS

## 2015-12-01 NOTE — Progress Notes (Signed)
[facility] OFFICE PROGRESS NOTE   Diagnosis:  Breast cancer Oncology History   Breast cancer of upper-outer quadrant of left female breast [facility])  Staging form: Breast, AJCC 7th Edition  Clinical stage from 09/28/2015: Stage IIA (T2, N0, M0) - Signed by Truitt Merle, MD on 10/03/2015       Breast cancer of upper-outer quadrant of left female breast ([place])   09/27/2015 Initial Diagnosis Breast cancer of upper-outer quadrant of left female breast ([place])   09/27/2015 Initial Biopsy Left breast core needle biopsy of the mass at the 3:00 position showed invasive and in situ ductal carcinoma, lymphovascular invasion is identified.   09/27/2015 Receptors her2 ER 95% positive, PR negative, HER-2 negative, Ki-67 60%   09/27/2015 Mammogram diagnostic mammogram and of her left breast showa 2.3 cm mass in the 3:00 position retroareolar left breast   09/27/2015 Oncotype testing RS: 55 high risk, the group average risk of distant recurrence for patient's wishes recurrence score>50 was 34%.   10/19/2015 Imaging Breast MRI with and without contrast showed known 3.5cm left breast malignancy at 3:00 on a background of innumerable enhancing bilateral foci. Additional suspicious mass 19 x 11 x 13 mm at 2:00 position of left breast   11/03/2015 Pathology Results MRI guided second left breast biopsy showed invasive ductal carcinoma, ER 100% positive, PR negative, HER-2 negative Ki-67 5%        INTERVAL HISTORY:   Brooke Garrison returns as scheduled. She completed cycle 2 Taxotere/Cytoxan 11/10/2015. She had mild nausea beginning around day 3. The nausea lasted about 3 days. Compazine was effective for the nausea. She had "heartburn" around the same time as the nausea. She has not been taking omeprazole. She did not have constipation following cycle 2. She took a stool softener. No mouth sores. She thinks the left breast mass is smaller.  Objective:  Vital signs in last 24  hours:  Blood pressure 109/64, pulse 67, temperature 97.8 F (36.6 C), temperature source Oral, resp. rate 17, height 5' (1.524 m), weight 188 lb (85.276 kg), SpO2 99 %.    HEENT: No thrush or ulcers. Resp: Lungs clear bilaterally. Cardio: Regular rate and rhythm. GI: Abdomen soft and nontender. No hepatomegaly. Vascular: No leg edema. Neuro: Alert and oriented.  Breast: Masslike firmness 3:00 position left breast retroareolar. Difficult to estimate measurement.    Lab Results:  Lab Results  Component Value Date   WBC 18.3* 12/01/2015   HGB 11.2* 12/01/2015   HCT 33.9* 12/01/2015   MCV 96.0 12/01/2015   PLT 227 12/01/2015   NEUTROABS 13.8* 12/01/2015    Imaging:  No results found.  Medications: I have reviewed the patient's current medications.   Assessment/plan: 1. Breast cancer of upper-outer quadrant of left female breast, multi-focal (2) cT2N0M0, stage IIA, ER+/PR-/HER2+- Ki67 60% and 5%; neoadjuvant chemotherapy initiated with Taxotere/Cytoxan on a 3 week schedule 10/20/2015. Cycle 2 completed on 11/10/2015. 2. Rheumatoid arthritis 3. Hypothyroidism 4. "Heartburn" following cycle 2 Taxotere/Cytoxan. Question related to steroids. She will resume omeprazole. 5. Constipation secondary to chemotherapy. Resolved with a stool softener. 6. Insomnia related to steroids. She has Ambien to take as needed.  Disposition: Brooke Garrison appears stable. She has completed 2 cycles of neoadjuvant Taxotere/Cytoxan. Plan to proceed with cycle 3 today as scheduled.  Her main complaint following cycle 2 was "heartburn". This may have been related to the dexamethasone. She was instructed to resume omeprazole.  She will return for a follow-up visit and cycle 4 Taxotere/Cytoxan in 3  weeks. She will contact the office in the interim with any problems.  Ned Card ANP/GNP-BC   12/01/2015  9:38 AM

## 2015-12-01 NOTE — Telephone Encounter (Signed)
left msg for Breast MRI apt on 6/23

## 2015-12-01 NOTE — Patient Instructions (Addendum)
[place] Discharge Instructions for Patients Receiving Chemotherapy  Today you received the following chemotherapy agents: Taxotere. To help prevent nausea and vomiting after your treatment, we encourage you to take your nausea medication: Compazine 10 mg every 6 hours as needed; Zofran 8 mg every 12 hours as needed.   If you develop nausea and vomiting that is not controlled by your nausea medication, call the clinic.   BELOW ARE SYMPTOMS THAT SHOULD BE REPORTED IMMEDIATELY:  *FEVER GREATER THAN 100.5 F  *CHILLS WITH OR WITHOUT FEVER  NAUSEA AND VOMITING THAT IS NOT CONTROLLED WITH YOUR NAUSEA MEDICATION  *UNUSUAL SHORTNESS OF BREATH  *UNUSUAL BRUISING OR BLEEDING  TENDERNESS IN MOUTH AND THROAT WITH OR WITHOUT PRESENCE OF ULCERS  *URINARY PROBLEMS  *BOWEL PROBLEMS  UNUSUAL RASH Items with * indicate a potential emergency and should be followed up as soon as possible.  Feel free to call the clinic you have any questions or concerns. The clinic phone number is [phone].  Please show the [place] at check-in to the Emergency Department and triage nurse.  Cyclophosphamide injection What is this medicine? CYCLOPHOSPHAMIDE (sye kloe FOSS fa mide) is a chemotherapy drug. It slows the growth of cancer cells. This medicine is used to treat many types of cancer like lymphoma, myeloma, leukemia, breast cancer, and ovarian cancer, to name a few. This medicine may be used for other purposes; ask your health care provider or pharmacist if you have questions. What should I tell my health care provider before I take this medicine? They need to know if you have any of these conditions: -blood disorders -history of other chemotherapy -infection -kidney disease -liver disease -recent or ongoing radiation therapy -tumors in the bone marrow -an unusual or allergic reaction to cyclophosphamide, other chemotherapy, other medicines, foods, dyes, or  preservatives -pregnant or trying to get pregnant -breast-feeding How should I use this medicine? This drug is usually given as an injection into a vein or muscle or by infusion into a vein. It is administered in a hospital or clinic by a specially trained health care professional. Talk to your pediatrician regarding the use of this medicine in children. Special care may be needed. Overdosage: If you think you have taken too much of this medicine contact a poison control center or emergency room at once. NOTE: This medicine is only for you. Do not share this medicine with others. What if I miss a dose? It is important not to miss your dose. Call your doctor or health care professional if you are unable to keep an appointment. What may interact with this medicine? This medicine may interact with the following medications: -amiodarone -amphotericin B -azathioprine -certain antiviral medicines for HIV or AIDS such as protease inhibitors (e.g., indinavir, ritonavir) and zidovudine -certain blood pressure medications such as benazepril, captopril, enalapril, fosinopril, lisinopril, moexipril, monopril, perindopril, quinapril, ramipril, trandolapril -certain cancer medications such as anthracyclines (e.g., daunorubicin, doxorubicin), busulfan, cytarabine, paclitaxel, pentostatin, tamoxifen, trastuzumab -certain diuretics such as chlorothiazide, chlorthalidone, hydrochlorothiazide, indapamide, metolazone -certain medicines that treat or prevent blood clots like warfarin -certain muscle relaxants such as succinylcholine -cyclosporine -etanercept -indomethacin -medicines to increase blood counts like filgrastim, pegfilgrastim, sargramostim -medicines used as general anesthesia -metronidazole -natalizumab This list may not describe all possible interactions. Give your health care provider a list of all the medicines, herbs, non-prescription drugs, or dietary supplements you use. Also tell them if  you smoke, drink alcohol, or use illegal drugs. Some items may interact with your  medicine. What should I watch for while using this medicine? Visit your doctor for checks on your progress. This drug may make you feel generally unwell. This is not uncommon, as chemotherapy can affect healthy cells as well as cancer cells. Report any side effects. Continue your course of treatment even though you feel ill unless your doctor tells you to stop. Drink water or other fluids as directed. Urinate often, even at night. In some cases, you may be given additional medicines to help with side effects. Follow all directions for their use. Call your doctor or health care professional for advice if you get a fever, chills or sore throat, or other symptoms of a cold or flu. Do not treat yourself. This drug decreases your body's ability to fight infections. Try to avoid being around people who are sick. This medicine may increase your risk to bruise or bleed. Call your doctor or health care professional if you notice any unusual bleeding. Be careful brushing and flossing your teeth or using a toothpick because you may get an infection or bleed more easily. If you have any dental work done, tell your dentist you are receiving this medicine. You may get drowsy or dizzy. Do not drive, use machinery, or do anything that needs mental alertness until you know how this medicine affects you. Do not become pregnant while taking this medicine or for 1 year after stopping it. Women should inform their doctor if they wish to become pregnant or think they might be pregnant. Men should not father a child while taking this medicine and for 4 months after stopping it. There is a potential for serious side effects to an unborn child. Talk to your health care professional or pharmacist for more information. Do not breast-feed an infant while taking this medicine. This medicine may interfere with the ability to have a child. This medicine  has caused ovarian failure in some women. This medicine has caused reduced sperm counts in some men. You should talk with your doctor or health care professional if you are concerned about your fertility. If you are going to have surgery, tell your doctor or health care professional that you have taken this medicine. What side effects may I notice from receiving this medicine? Side effects that you should report to your doctor or health care professional as soon as possible: -allergic reactions like skin rash, itching or hives, swelling of the face, lips, or tongue -low blood counts - this medicine may decrease the number of white blood cells, red blood cells and platelets. You may be at increased risk for infections and bleeding. -signs of infection - fever or chills, cough, sore throat, pain or difficulty passing urine -signs of decreased platelets or bleeding - bruising, pinpoint red spots on the skin, black, tarry stools, blood in the urine -signs of decreased red blood cells - unusually weak or tired, fainting spells, lightheadedness -breathing problems -dark urine -dizziness -palpitations -swelling of the ankles, feet, hands -trouble passing urine or change in the amount of urine -weight gain -yellowing of the eyes or skin Side effects that usually do not require medical attention (report to your doctor or health care professional if they continue or are bothersome): -changes in nail or skin color -hair loss -missed menstrual periods -mouth sores -nausea, vomiting This list may not describe all possible side effects. Call your doctor for medical advice about side effects. You may report side effects to FDA at 1-800-FDA-1088. Where should I keep my medicine? This drug  is given in a hospital or clinic and will not be stored at home. NOTE: This sheet is a summary. It may not cover all possible information. If you have questions about this medicine, talk to your doctor, pharmacist, or  health care provider.    2016, Elsevier/Gold Standard. (2012-05-15 16:22:58)

## 2015-12-05 ENCOUNTER — Ambulatory Visit: Payer: Managed Care, Other (non HMO) | Admitting: Nutrition

## 2015-12-05 NOTE — Progress Notes (Signed)
Received a message that patient had questions about nutrition. Called patient on telephone and offered appointment.  However, patient declined. She would like some information over the phone. She has questions about what foods might have carcinogens in them and also what kind of exercise she would be able to do to increase her strength. Educated patient on healthy cooking methods and referred her to [place] for [employer] for further details. Encouraged patient to begin a safe walking program beginning with 10 minutes at a time and gradually increasing as tolerated. Encouraged patient to discuss exercise guidelines with physician. Educated patient about the live strong program. Patient was appreciative of information. She has my contact information for further questions.

## 2015-12-22 ENCOUNTER — Ambulatory Visit: Payer: Managed Care, Other (non HMO)

## 2015-12-22 ENCOUNTER — Ambulatory Visit (HOSPITAL_BASED_OUTPATIENT_CLINIC_OR_DEPARTMENT_OTHER): Payer: Managed Care, Other (non HMO) | Admitting: Hematology

## 2015-12-22 ENCOUNTER — Telehealth: Payer: Self-pay | Admitting: Hematology

## 2015-12-22 ENCOUNTER — Encounter: Payer: Self-pay | Admitting: Hematology

## 2015-12-22 ENCOUNTER — Encounter: Payer: Self-pay | Admitting: *Deleted

## 2015-12-22 ENCOUNTER — Other Ambulatory Visit: Payer: Managed Care, Other (non HMO)

## 2015-12-22 ENCOUNTER — Ambulatory Visit: Payer: Managed Care, Other (non HMO) | Admitting: Hematology

## 2015-12-22 ENCOUNTER — Other Ambulatory Visit (HOSPITAL_BASED_OUTPATIENT_CLINIC_OR_DEPARTMENT_OTHER): Payer: Managed Care, Other (non HMO)

## 2015-12-22 ENCOUNTER — Ambulatory Visit (HOSPITAL_BASED_OUTPATIENT_CLINIC_OR_DEPARTMENT_OTHER): Payer: Managed Care, Other (non HMO)

## 2015-12-22 VITALS — BP 113/72 | HR 58 | Temp 97.7°F | Resp 20 | Ht 60.0 in | Wt 189.4 lb

## 2015-12-22 DIAGNOSIS — R197 Diarrhea, unspecified: Secondary | ICD-10-CM

## 2015-12-22 DIAGNOSIS — E039 Hypothyroidism, unspecified: Secondary | ICD-10-CM | POA: Diagnosis not present

## 2015-12-22 DIAGNOSIS — C50412 Malignant neoplasm of upper-outer quadrant of left female breast: Secondary | ICD-10-CM

## 2015-12-22 DIAGNOSIS — M069 Rheumatoid arthritis, unspecified: Secondary | ICD-10-CM | POA: Diagnosis not present

## 2015-12-22 DIAGNOSIS — G4700 Insomnia, unspecified: Secondary | ICD-10-CM | POA: Diagnosis not present

## 2015-12-22 DIAGNOSIS — K5903 Drug induced constipation: Secondary | ICD-10-CM

## 2015-12-22 DIAGNOSIS — Z5189 Encounter for other specified aftercare: Secondary | ICD-10-CM

## 2015-12-22 DIAGNOSIS — Z5111 Encounter for antineoplastic chemotherapy: Secondary | ICD-10-CM

## 2015-12-22 LAB — CBC WITH DIFFERENTIAL/PLATELET
BASO%: 0.1 % (ref 0.0–2.0)
Basophils Absolute: 0 10*3/uL (ref 0.0–0.1)
EOS%: 0 % (ref 0.0–7.0)
Eosinophils Absolute: 0 10*3/uL (ref 0.0–0.5)
HEMATOCRIT: 31.8 % — AB (ref 34.8–46.6)
HGB: 10.6 g/dL — ABNORMAL LOW (ref 11.6–15.9)
LYMPH#: 3.4 10*3/uL — AB (ref 0.9–3.3)
LYMPH%: 21.6 % (ref 14.0–49.7)
MCH: 32.3 pg (ref 25.1–34.0)
MCHC: 33.3 g/dL (ref 31.5–36.0)
MCV: 97 fL (ref 79.5–101.0)
MONO#: 1.8 10*3/uL — ABNORMAL HIGH (ref 0.1–0.9)
MONO%: 11.4 % (ref 0.0–14.0)
NEUT#: 10.5 10*3/uL — ABNORMAL HIGH (ref 1.5–6.5)
NEUT%: 66.9 % (ref 38.4–76.8)
Platelets: 294 10*3/uL (ref 145–400)
RBC: 3.28 10*6/uL — AB (ref 3.70–5.45)
RDW: 17.9 % — ABNORMAL HIGH (ref 11.2–14.5)
WBC: 15.6 10*3/uL — ABNORMAL HIGH (ref 3.9–10.3)

## 2015-12-22 LAB — COMPREHENSIVE METABOLIC PANEL
ALT: 26 U/L (ref 0–55)
AST: 18 U/L (ref 5–34)
Albumin: 3.7 g/dL (ref 3.5–5.0)
Alkaline Phosphatase: 71 U/L (ref 40–150)
Anion Gap: 7 mEq/L (ref 3–11)
BUN: 25.1 mg/dL (ref 7.0–26.0)
CHLORIDE: 108 meq/L (ref 98–109)
CO2: 25 meq/L (ref 22–29)
CREATININE: 0.8 mg/dL (ref 0.6–1.1)
Calcium: 9.8 mg/dL (ref 8.4–10.4)
EGFR: 80 mL/min/{1.73_m2} — ABNORMAL LOW (ref >90)
GLUCOSE: 100 mg/dL (ref 70–140)
Potassium: 4.1 mEq/L (ref 3.5–5.1)
SODIUM: 140 meq/L (ref 136–145)
Total Bilirubin: 0.42 mg/dL (ref 0.20–1.20)
Total Protein: 6.8 g/dL (ref 6.4–8.3)

## 2015-12-22 MED ORDER — SODIUM CHLORIDE 0.9 % IV SOLN
600.0000 mg/m2 | Freq: Once | INTRAVENOUS | Status: AC
  Administered 2015-12-22: 1120 mg via INTRAVENOUS
  Filled 2015-12-22: qty 56

## 2015-12-22 MED ORDER — DEXAMETHASONE SODIUM PHOSPHATE 100 MG/10ML IJ SOLN
10.0000 mg | Freq: Once | INTRAMUSCULAR | Status: AC
  Administered 2015-12-22: 10 mg via INTRAVENOUS
  Filled 2015-12-22: qty 1

## 2015-12-22 MED ORDER — PEGFILGRASTIM 6 MG/0.6ML ~~LOC~~ PSKT
6.0000 mg | PREFILLED_SYRINGE | Freq: Once | SUBCUTANEOUS | Status: AC
  Administered 2015-12-22: 6 mg via SUBCUTANEOUS
  Filled 2015-12-22: qty 0.6

## 2015-12-22 MED ORDER — SODIUM CHLORIDE 0.9 % IV SOLN
Freq: Once | INTRAVENOUS | Status: AC
  Administered 2015-12-22: 15:00:00 via INTRAVENOUS

## 2015-12-22 MED ORDER — PALONOSETRON HCL INJECTION 0.25 MG/5ML
0.2500 mg | Freq: Once | INTRAVENOUS | Status: AC
  Administered 2015-12-22: 0.25 mg via INTRAVENOUS

## 2015-12-22 MED ORDER — DOCETAXEL CHEMO INJECTION 160 MG/16ML
75.0000 mg/m2 | Freq: Once | INTRAVENOUS | Status: AC
  Administered 2015-12-22: 140 mg via INTRAVENOUS
  Filled 2015-12-22: qty 8

## 2015-12-22 MED ORDER — PALONOSETRON HCL INJECTION 0.25 MG/5ML
INTRAVENOUS | Status: AC
  Filled 2015-12-22: qty 5

## 2015-12-22 NOTE — Progress Notes (Signed)
[facility]  Telephone:([phone]) [phone] Fax:([phone]) [phone]  Clinic Follow Up Note   Patient Care Team: Larence Penning, MD as PCP - General (Internal Medicine) Deliah Goody (Specialist) Janyth Pupa, DO as Consulting Physician (Obstetrics and Gynecology) Rolm Bookbinder, MD as Consulting Physician (General Surgery) Thea Silversmith, MD as Consulting Physician (Radiation Oncology) 12/22/2015  CHIEF COMPLAINTS:  Follow up left breast cancer  Oncology History   Breast cancer of upper-outer quadrant of left female breast [facility])   Staging form: Breast, AJCC 7th Edition     Clinical stage from 09/28/2015: Stage IIA (T2, N0, M0) - Signed by Truitt Merle, MD on 10/03/2015       Breast cancer of upper-outer quadrant of left female breast ([place])   09/27/2015 Initial Diagnosis Breast cancer of upper-outer quadrant of left female breast ([place])   09/27/2015 Initial Biopsy Left breast core needle biopsy of the mass at the 3:00 position showed invasive and in situ ductal carcinoma, lymphovascular invasion is identified.   09/27/2015 Receptors her2 ER 95% positive, PR negative, HER-2 negative, Ki-67 60%   09/27/2015 Mammogram diagnostic mammogram and of her left breast showa 2.3 cm mass in the 3:00 position retroareolar left breast   09/27/2015 Oncotype testing RS: 55 high risk, the group average risk of distant recurrence for patient's wishes recurrence score>50 was 34%.   10/19/2015 Imaging Breast MRI with and without contrast showed known 3.5cm left breast malignancy at 3:00 on a background of innumerable enhancing bilateral foci. Additional suspicious mass 19 x 11 x 13 mm at 2:00 position of left breast   11/03/2015 Pathology Results MRI guided second left breast biopsy showed invasive ductal carcinoma, ER 100% positive, PR negative, HER-2 negative Ki-67 5%    HISTORY OF PRESENTING ILLNESS:  Brooke Garrison 55 y.o. female is here because of er newly diagnosed left She is accompanied by her  daughter-in-law her to our multidisciplinary breast clinic today.  She was found to have a palpable mass in her left breast by her GYN during her routine visit, no tenderness, skin change or nipple discharge. She denies any new syndroms, and feels well overall. she had mammogram one year ago which was normal, she had left breast biopsy which was benigh 20 years ago.   She ahs RA, on MTX and humaria for 4-5 years, she had significant multiple joint pain when she was diagnosed 6-7 years ago, and improved sighnitantly with treatment, she has mild joint pain, which get worse with physical exertion. She is married, liveswith her husband in [place], and works in [place]. Her husband had ruptured appendix about 6 weeks ago,is still recovering from surgery.   GYN HISTORY  Menarchal: 10 LMP: 2009 (hysrectomy)  Contraceptive:25 years  HRT: no  G2P2: she has 57 and 53 yo sons   CURRENT THERAPY:  neoadjuvant chemo TC every 3 weeks started on 10/20/2015  INTERIM HISTORY:  Brooke returns for follow up and 4th cycle chemotherapy. She has been tolerating chemo well, she had constipation for several days after chemotherapy, subsequently developed diarrhea for a few days, which has resolved. Mild fatigue, no significant nausea, or other complaints.  MEDICAL HISTORY:  Past Medical History  Diagnosis Date  . Breast cancer of upper-outer quadrant of left female breast ([place]) 09/29/2015  . Breast cancer ([place])   . Arthritis   . Thyroid disease     SURGICAL HISTORY: Past Surgical History  Procedure Laterality Date  . Abdominal hysterectomy      SOCIAL HISTORY: Social History  Social History  . Marital Status: Unknown    Spouse Name: N/A  . Number of Children: N/A  . Years of Education: N/A   Occupational History  . Not on file.   Social History Main Topics  . Smoking status: Never Smoker   . Smokeless tobacco: Not on file  . Alcohol Use: Yes  . Drug Use: No  . Sexual Activity: Not on file     Other Topics Concern  . Not on file   Social History Narrative    FAMILY HISTORY: Family History  Problem Relation Age of Onset  . Hodgkin's lymphoma Mother   . Melanoma Maternal Grandfather     ALLERGIES:  is allergic to sulfa antibiotics.  MEDICATIONS:  Current Outpatient Prescriptions  Medication Sig Dispense Refill  . acyclovir (ZOVIRAX) 400 MG tablet Take 800 mg by mouth daily.  10  . cetirizine (ZYRTEC) 10 MG tablet Take 10 mg by mouth daily.    Marland Kitchen dexamethasone (DECADRON) 4 MG tablet Take 2 tablets (8 mg total) by mouth 2 (two) times daily. Start the day before Taxotere. Then again the day after chemo for 3 days. 30 tablet 1  . folic acid (FOLVITE) 1 MG tablet Take 1 mg by mouth 2 (two) times daily.  5  . liothyronine (CYTOMEL) 5 MCG tablet Take 5 mcg by mouth daily.  5  . LORazepam (ATIVAN) 0.5 MG tablet Take 1 tablet (0.5 mg total) by mouth every 6 (six) hours as needed for anxiety or sleep (anxiety). 20 tablet 0  . methotrexate (RHEUMATREX) 2.5 MG tablet Take 15 mg by mouth once a week. 6 tablets weekly.  3  . naproxen (NAPROSYN) 500 MG tablet Take 500 mg by mouth as needed.    Marland Kitchen omeprazole (PRILOSEC) 20 MG capsule Take 1 capsule (20 mg total) by mouth daily. 30 capsule 1  . ondansetron (ZOFRAN) 8 MG tablet Take 1 tablet (8 mg total) by mouth 2 (two) times daily as needed for refractory nausea / vomiting. Start on day 3 after chemo. 30 tablet 1  . prochlorperazine (COMPAZINE) 10 MG tablet Take 1 tablet (10 mg total) by mouth every 6 (six) hours as needed (Nausea or vomiting). 30 tablet 1  . TIROSINT 75 MCG CAPS TAKE ONE TABLET 6 DAYS A WEEK  6  . traMADol (ULTRAM) 50 MG tablet Take 50 mg by mouth as needed.    . zolpidem (AMBIEN) 5 MG tablet Take 1 tablet (5 mg total) by mouth at bedtime as needed for sleep. 30 tablet 0   No current facility-administered medications for this visit.    REVIEW OF SYSTEMS:   Constitutional: Denies fevers, chills or abnormal night  sweats Eyes: Denies blurriness of vision, double vision or watery eyes Ears, nose, mouth, throat, and face: Denies mucositis or sore throat Respiratory: Denies cough, dyspnea or wheezes Cardiovascular: Denies palpitation, chest discomfort or lower extremity swelling Gastrointestinal:  Denies nausea, heartburn or change in bowel habits Skin: Denies abnormal skin rashes Lymphatics: Denies new lymphadenopathy or easy bruising Neurological:Denies numbness, tingling or new weaknesses Behavioral/Psych: Mood is stable, no new changes  All other systems were reviewed with the patient and are negative.  PHYSICAL EXAMINATION: ECOG PERFORMANCE STATUS: 1 - Symptomatic but completely ambulatory  Filed Vitals:   12/22/15 1336 12/22/15 1338  BP: 113/72 113/72  Pulse: 58 58  Temp: 97.7 F (36.5 C) 97.7 F (36.5 C)  Resp: 20 20   Filed Weights   12/22/15 1336 12/22/15 1338  Weight: 189  lb 6.4 oz (85.911 kg) 189 lb 6.4 oz (85.911 kg)    GENERAL:alert, no distress and comfortable SKIN: skin color, texture, turgor are normal, no rashes or significant lesions EYES: normal, conjunctiva are pink and non-injected, sclera clear OROPHARYNX:no exudate, no erythema and lips, buccal mucosa, and tongue normal  NECK: supple, thyroid normal size, non-tender, without nodularity LYMPH:  no palpable lymphadenopathy in the cervical, axillary or inguinal LUNGS: clear to auscultation and percussion with normal breathing effort HEART: regular rate & rhythm and no murmurs and no lower extremity edema ABDOMEN:abdomen soft, non-tender and normal bowel sounds Musculoskeletal:no cyanosis of digits and no clubbing  PSYCH: alert & oriented x 3 with fluent speech NEURO: no focal motor/sensory deficits Breasts: Breast inspection showed them to be symmetrical with no nipple discharge. Palpation of the breasts and axilla revealed a 2.0X1.0cm (initially 2.5X1.5cm) mass in 3:00 position retroareolar, softer than before. No  other obvious mass that I could appreciate.  LABORATORY DATA:  I have reviewed the data as listed CBC Latest Ref Rng 12/22/2015 12/01/2015 11/10/2015  WBC 3.9 - 10.3 10e3/uL 15.6(H) 18.3(H) 15.0(H)  Hemoglobin 11.6 - 15.9 g/dL 10.6(L) 11.2(L) 12.3  Hematocrit 34.8 - 46.6 % 31.8(L) 33.9(L) 37.5  Platelets 145 - 400 10e3/uL 294 227 333    CMP Latest Ref Rng 12/22/2015 12/01/2015 11/10/2015  Glucose 70 - 140 mg/dl 100 106 112  BUN 7.0 - 26.0 mg/dL 25.1 18.4 20.5  Creatinine 0.6 - 1.1 mg/dL 0.8 0.8 0.8  Sodium 136 - 145 mEq/L 140 143 141  Potassium 3.5 - 5.1 mEq/L 4.1 3.9 4.0  CO2 22 - 29 mEq/L 25 24 23   Calcium 8.4 - 10.4 mg/dL 9.8 9.5 9.7  Total Protein 6.4 - 8.3 g/dL 6.8 6.6 7.0  Total Bilirubin 0.20 - 1.20 mg/dL 0.42 0.44 0.43  Alkaline Phos 40 - 150 U/L 71 77 75  AST 5 - 34 U/L 18 15 51(H)  ALT 0 - 55 U/L 26 40 101(H)     PATHOLOGY REPORT   Diagnosis 09/27/2015 Breast, left, needle core biopsy, 3:00 o'clock, retroareolar - INVASIVE AND IN SITU MAMMARY CARCINOMA. - LYMPHOVASCULAR INVASION IS IDENTIFIED. - SEE COMMENT. Microscopic Comment The carcinoma appears grade 2. An E-cadherin stain will be performed and the results reported separately. A breast prognostic profile will be performed and the results reported separately. The results were called to [facility] on 09/28/2015. (JBK:ecj 09/28/2015) The malignant cells are positive for E-Cadherin, supporting a ductal phenotype.  Results: IMMUNOHISTOCHEMICAL AND MORPHOMETRIC ANALYSIS PERFORMED MANUALLY Estrogen Receptor: 95%, POSITIVE, STRONG STAINING INTENSITY Progesterone Receptor: 0%, NEGATIVE Proliferation Marker Ki67: 60% Results: HER2 - NEGATIVE RATIO OF HER2/CEP17 SIGNALS 1.04 AVERAGE HER2 COPY NUMBER PER CELL 1.20  Oncotype DX RS: 55 high risk, the group average risk of distant recurrence for patient's wishes recurrence score>50 was 34%.  Diagnosis 11/03/2015 Breast, left, needle core biopsy, upper  outer quadrant - INVASIVE MAMMARY CARCINOMA. - MAMMARY CARCINOMA IN SITU. Microscopic Comment The findings are consistent with grade 2/3 invasive carcinoma and most likely represent invasive ductal as confirmed on the previous core biopsies ([id number]). E. cadherin and breast prognostic profile will be performed. Dr Lyndon Code agrees. Called to [facility] on 11/06/2015. (JDP:ecj 11/06/2015) Addendum: Immunohistochemistry with E. cadherin shows diffuse strong positivity consistent with ductal carcinoma. (JDP:ecj 11/06/2015)  PROGNOSTIC INDICATORS Results: IMMUNOHISTOCHEMICAL AND MORPHOMETRIC ANALYSIS PERFORMED MANUALLY Estrogen Receptor: 100%, POSITIVE, STRONG STAINING INTENSITY Progesterone Receptor: 0%, NEGATIVE Proliferation Marker Ki67: 5% Results: HER2 - NEGATIVE RATIO  OF HER2/CEP17 SIGNALS 1.19 AVERAGE HER2 COPY NUMBER PER CELL 1.85   RADIOGRAPHIC STUDIES: I have personally reviewed the radiological images as listed and agreed with the findings in the report.  09/28/2015  CLINICAL DATA:  2.3 cm mass in the 3 o'clock retroareolar region of the left breast with imaging findings suspicious for malignancy. EXAM: ULTRASOUND GUIDED LEFT BREAST CORE NEEDLE BIOPSY COMPARISON:  Previous exam(s). FINDINGS: I met with the patient and we discussed the procedure of ultrasound-guided biopsy, including benefits and alternatives. We discussed the high likelihood of a successful procedure. We discussed the risks of the procedure, including infection, bleeding, tissue injury, clip migration, and inadequate sampling. Informed written consent was given. The usual time-out protocol was performed immediately prior to the procedure. Using sterile technique and 1% Lidocaine as local anesthetic, under direct ultrasound visualization, a 12 gauge spring-loaded device was used to perform biopsy of the recently demonstrated 2.3 cm mass in the 3 o'clock retroareolar region of the left breast using a  caudal approach. At the conclusion of the procedure a ribbon shaped tissue marker clip was deployed into the biopsy cavity. Follow up 2 view mammogram was performed and dictated separately. IMPRESSION: Ultrasound guided biopsy of a 2.3 cm mass in the 3 o'clock retroareolar left breast. No apparent complications. Electronically Signed: By: Claudie Revering M.D. On: 09/27/2015 11:01   Breast MRI w wo contrast 10/19/2015 IMPRESSION: Known left breast malignancy on a background of innumerable enhancing bilateral foci. Additional suspicious mass upper outer quadrant left breast.  RECOMMENDATION: Recommend second-look ultrasound in anticipation of biopsy. If the upper outer quadrant mass proves to be sonographically occult, MRI-guided biopsy would be recommended.   ASSESSMENT & PLAN: 55 year old caucasian female, with past medical history of rheumatoid arthritis, on  And hypothyroidism, presented with palpable left breast mass.  1. Breast cancer of upper-outer quadrant of left female breast, multi-focal (2) cT2N0M0, stage IIA, ER+/PR-/HER2+- Ki67 60% and 5%  -I previously reviewed her breast MRI findings and second left breast biopsy result  -she has multifocal disease, the first 3 clock lesion is larger, and appears to be more aggressive with high Ki-67 and high Oncotype recurrence score. The second left breast is lesion at 2:00 position is smaller, with low Ki-67, likely more indolent. Both lesions are ER positive, PR negative, HER-2 negative. - based on her Oncotype DX test result, I recommended her consider neoadjuvant chemotherapy docetaxel and Cytoxan , every 3 weeks, for total of 4 cycles. -She is tolerating chemotherapy well, lab reviewed, we'll proceed her last cycle chemotherapy today -She is scheduled to have a repeated breast MRI on June 20 to evaluate her chemotherapy response -Dr. Donne Hazel will review her image, and decide if she still can have lumpectomy based on the location of these 2  tumors -I recommend adjuvant endocrine therapy to reduce her risk of recurrence. She had hysterectomy, the gynecologist has tested her hormone level, and feels she is still premenopausal. I recommend her to have tamoxifen, she had many questions about tamoxifen, after she searched the information about tamoxifen on Internet, she is very concerned about carcinogenesis from tamoxifen. I reassured her that she has no risk of endometrial cancer from tamoxifen since she had hysterectomy. I answered her questions in great details, and will discus again after her surgery.   2. Rheumatology arthritis -She'll continue to follow-up with her rheumatologist -I previously spoke with her rhumatologist Dr. Billey Chang today and he agrees to hold methotrexateand (until complete RT) and Humira (at least for 5  years)  3. hypothyroidism -She will continue Synthroid and follow up with PCP   4. 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 Donne Hazel after her MRI -I'll see her back in 1 month for follow-up.  All questions were answered. The patient knows to call the clinic with any problems, questions or concerns. I spent 20 minutes counseling the patient face to face. The total time spent in the appointment was 25 minutes and more than 50% was on counseling.     Truitt Merle, MD 12/22/2015

## 2015-12-22 NOTE — Patient Instructions (Signed)
[facility] Discharge Instructions for Patients Receiving Chemotherapy  Today you received the following chemotherapy agents Taxotere/Cytoxan To help prevent nausea and vomiting after your treatment, we encourage you to take your nausea medication as prescribed.   If you develop nausea and vomiting that is not controlled by your nausea medication, call the clinic.   BELOW ARE SYMPTOMS THAT SHOULD BE REPORTED IMMEDIATELY:  *FEVER GREATER THAN 100.5 F  *CHILLS WITH OR WITHOUT FEVER  NAUSEA AND VOMITING THAT IS NOT CONTROLLED WITH YOUR NAUSEA MEDICATION  *UNUSUAL SHORTNESS OF BREATH  *UNUSUAL BRUISING OR BLEEDING  TENDERNESS IN MOUTH AND THROAT WITH OR WITHOUT PRESENCE OF ULCERS  *URINARY PROBLEMS  *BOWEL PROBLEMS  UNUSUAL RASH Items with * indicate a potential emergency and should be followed up as soon as possible.  Feel free to call the clinic you have any questions or concerns. The clinic phone number is [phone].  Please show the [facility] at check-in to the Emergency Department and triage nurse.   Docetaxel injection (Taxotere) What is this medicine? DOCETAXEL (doe se TAX el) is a chemotherapy drug. It targets fast dividing cells, like cancer cells, and causes these cells to die. This medicine is used to treat many types of cancers like breast cancer, certain stomach cancers, head and neck cancer, lung cancer, and prostate cancer. This medicine may be used for other purposes; ask your health care provider or pharmacist if you have questions. What should I tell my health care provider before I take this medicine? They need to know if you have any of these conditions: -infection (especially a virus infection such as chickenpox, cold sores, or herpes) -liver disease -low blood counts, like low white cell, platelet, or red cell counts -an unusual or allergic reaction to docetaxel, polysorbate 80, other chemotherapy agents, other medicines, foods,  dyes, or preservatives -pregnant or trying to get pregnant -breast-feeding How should I use this medicine? This drug is given as an infusion into a vein. It is administered in a hospital or clinic by a specially trained health care professional. Talk to your pediatrician regarding the use of this medicine in children. Special care may be needed. Overdosage: If you think you have taken too much of this medicine contact a poison control center or emergency room at once. NOTE: This medicine is only for you. Do not share this medicine with others. What if I miss a dose? It is important not to miss your dose. Call your doctor or health care professional if you are unable to keep an appointment. What may interact with this medicine? -cyclosporine -erythromycin -ketoconazole -medicines to increase blood counts like filgrastim, pegfilgrastim, sargramostim -vaccines Talk to your doctor or health care professional before taking any of these medicines: -acetaminophen -aspirin -ibuprofen -ketoprofen -naproxen This list may not describe all possible interactions. Give your health care provider a list of all the medicines, herbs, non-prescription drugs, or dietary supplements you use. Also tell them if you smoke, drink alcohol, or use illegal drugs. Some items may interact with your medicine. What should I watch for while using this medicine? Your condition will be monitored carefully while you are receiving this medicine. You will need important blood work done while you are taking this medicine. This drug may make you feel generally unwell. This is not uncommon, as chemotherapy can affect healthy cells as well as cancer cells. Report any side effects. Continue your course of treatment even though you feel ill unless your doctor tells you to  stop. In some cases, you may be given additional medicines to help with side effects. Follow all directions for their use. Call your doctor or health care  professional for advice if you get a fever, chills or sore throat, or other symptoms of a cold or flu. Do not treat yourself. This drug decreases your body's ability to fight infections. Try to avoid being around people who are sick. This medicine may increase your risk to bruise or bleed. Call your doctor or health care professional if you notice any unusual bleeding. This medicine may contain alcohol in the product. You may get drowsy or dizzy. Do not drive, use machinery, or do anything that needs mental alertness until you know how this medicine affects you. Do not stand or sit up quickly, especially if you are an older patient. This reduces the risk of dizzy or fainting spells. Avoid alcoholic drinks. Do not become pregnant while taking this medicine. Women should inform their doctor if they wish to become pregnant or think they might be pregnant. There is a potential for serious side effects to an unborn child. Talk to your health care professional or pharmacist for more information. Do not breast-feed an infant while taking this medicine. What side effects may I notice from receiving this medicine? Side effects that you should report to your doctor or health care professional as soon as possible: -allergic reactions like skin rash, itching or hives, swelling of the face, lips, or tongue -low blood counts - This drug may decrease the number of white blood cells, red blood cells and platelets. You may be at increased risk for infections and bleeding. -signs of infection - fever or chills, cough, sore throat, pain or difficulty passing urine -signs of decreased platelets or bleeding - bruising, pinpoint red spots on the skin, black, tarry stools, nosebleeds -signs of decreased red blood cells - unusually weak or tired, fainting spells, lightheadedness -breathing problems -fast or irregular heartbeat -low blood pressure -mouth sores -nausea and vomiting -pain, swelling, redness or irritation at  the injection site -pain, tingling, numbness in the hands or feet -swelling of the ankle, feet, hands -weight gain Side effects that usually do not require medical attention (report to your prescriber or health care professional if they continue or are bothersome): -bone pain -complete hair loss including hair on your head, underarms, pubic hair, eyebrows, and eyelashes -diarrhea -excessive tearing -changes in the color of fingernails -loosening of the fingernails -nausea -muscle pain -red flush to skin -sweating -weak or tired This list may not describe all possible side effects. Call your doctor for medical advice about side effects. You may report side effects to FDA at 1-800-FDA-1088. Where should I keep my medicine? This drug is given in a hospital or clinic and will not be stored at home. NOTE: This sheet is a summary. It may not cover all possible information. If you have questions about this medicine, talk to your doctor, pharmacist, or health care provider.    2016, Elsevier/Gold Standard. (2014-07-18 16:04:57)  Cyclophosphamide injection (Cytoxan) What is this medicine? CYCLOPHOSPHAMIDE (sye kloe FOSS fa mide) is a chemotherapy drug. It slows the growth of cancer cells. This medicine is used to treat many types of cancer like lymphoma, myeloma, leukemia, breast cancer, and ovarian cancer, to name a few. This medicine may be used for other purposes; ask your health care provider or pharmacist if you have questions. What should I tell my health care provider before I take this medicine? They need to  know if you have any of these conditions: -blood disorders -history of other chemotherapy -infection -kidney disease -liver disease -recent or ongoing radiation therapy -tumors in the bone marrow -an unusual or allergic reaction to cyclophosphamide, other chemotherapy, other medicines, foods, dyes, or preservatives -pregnant or trying to get pregnant -breast-feeding How  should I use this medicine? This drug is usually given as an injection into a vein or muscle or by infusion into a vein. It is administered in a hospital or clinic by a specially trained health care professional. Talk to your pediatrician regarding the use of this medicine in children. Special care may be needed. Overdosage: If you think you have taken too much of this medicine contact a poison control center or emergency room at once. NOTE: This medicine is only for you. Do not share this medicine with others. What if I miss a dose? It is important not to miss your dose. Call your doctor or health care professional if you are unable to keep an appointment. What may interact with this medicine? This medicine may interact with the following medications: -amiodarone -amphotericin B -azathioprine -certain antiviral medicines for HIV or AIDS such as protease inhibitors (e.g., indinavir, ritonavir) and zidovudine -certain blood pressure medications such as benazepril, captopril, enalapril, fosinopril, lisinopril, moexipril, monopril, perindopril, quinapril, ramipril, trandolapril -certain cancer medications such as anthracyclines (e.g., daunorubicin, doxorubicin), busulfan, cytarabine, paclitaxel, pentostatin, tamoxifen, trastuzumab -certain diuretics such as chlorothiazide, chlorthalidone, hydrochlorothiazide, indapamide, metolazone -certain medicines that treat or prevent blood clots like warfarin -certain muscle relaxants such as succinylcholine -cyclosporine -etanercept -indomethacin -medicines to increase blood counts like filgrastim, pegfilgrastim, sargramostim -medicines used as general anesthesia -metronidazole -natalizumab This list may not describe all possible interactions. Give your health care provider a list of all the medicines, herbs, non-prescription drugs, or dietary supplements you use. Also tell them if you smoke, drink alcohol, or use illegal drugs. Some items may interact  with your medicine. What should I watch for while using this medicine? Visit your doctor for checks on your progress. This drug may make you feel generally unwell. This is not uncommon, as chemotherapy can affect healthy cells as well as cancer cells. Report any side effects. Continue your course of treatment even though you feel ill unless your doctor tells you to stop. Drink water or other fluids as directed. Urinate often, even at night. In some cases, you may be given additional medicines to help with side effects. Follow all directions for their use. Call your doctor or health care professional for advice if you get a fever, chills or sore throat, or other symptoms of a cold or flu. Do not treat yourself. This drug decreases your body's ability to fight infections. Try to avoid being around people who are sick. This medicine may increase your risk to bruise or bleed. Call your doctor or health care professional if you notice any unusual bleeding. Be careful brushing and flossing your teeth or using a toothpick because you may get an infection or bleed more easily. If you have any dental work done, tell your dentist you are receiving this medicine. You may get drowsy or dizzy. Do not drive, use machinery, or do anything that needs mental alertness until you know how this medicine affects you. Do not become pregnant while taking this medicine or for 1 year after stopping it. Women should inform their doctor if they wish to become pregnant or think they might be pregnant. Men should not father a child while taking this medicine and  for 4 months after stopping it. There is a potential for serious side effects to an unborn child. Talk to your health care professional or pharmacist for more information. Do not breast-feed an infant while taking this medicine. This medicine may interfere with the ability to have a child. This medicine has caused ovarian failure in some women. This medicine has caused reduced  sperm counts in some men. You should talk with your doctor or health care professional if you are concerned about your fertility. If you are going to have surgery, tell your doctor or health care professional that you have taken this medicine. What side effects may I notice from receiving this medicine? Side effects that you should report to your doctor or health care professional as soon as possible: -allergic reactions like skin rash, itching or hives, swelling of the face, lips, or tongue -low blood counts - this medicine may decrease the number of white blood cells, red blood cells and platelets. You may be at increased risk for infections and bleeding. -signs of infection - fever or chills, cough, sore throat, pain or difficulty passing urine -signs of decreased platelets or bleeding - bruising, pinpoint red spots on the skin, black, tarry stools, blood in the urine -signs of decreased red blood cells - unusually weak or tired, fainting spells, lightheadedness -breathing problems -dark urine -dizziness -palpitations -swelling of the ankles, feet, hands -trouble passing urine or change in the amount of urine -weight gain -yellowing of the eyes or skin Side effects that usually do not require medical attention (report to your doctor or health care professional if they continue or are bothersome): -changes in nail or skin color -hair loss -missed menstrual periods -mouth sores -nausea, vomiting This list may not describe all possible side effects. Call your doctor for medical advice about side effects. You may report side effects to FDA at 1-800-FDA-1088. Where should I keep my medicine? This drug is given in a hospital or clinic and will not be stored at home. NOTE: This sheet is a summary. It may not cover all possible information. If you have questions about this medicine, talk to your doctor, pharmacist, or health care provider.    2016, Elsevier/Gold Standard. (2012-05-15  16:22:58)   Pegfilgrastim injection (Neulasta) What is this medicine? PEGFILGRASTIM (PEG fil gra stim) is a long-acting granulocyte colony-stimulating factor that stimulates the growth of neutrophils, a type of white blood cell important in the body's fight against infection. It is used to reduce the incidence of fever and infection in patients with certain types of cancer who are receiving chemotherapy that affects the bone marrow, and to increase survival after being exposed to high doses of radiation. This medicine may be used for other purposes; ask your health care provider or pharmacist if you have questions. What should I tell my health care provider before I take this medicine? They need to know if you have any of these conditions: -kidney disease -latex allergy -ongoing radiation therapy -sickle cell disease -skin reactions to acrylic adhesives (On-Body Injector only) -an unusual or allergic reaction to pegfilgrastim, filgrastim, other medicines, foods, dyes, or preservatives -pregnant or trying to get pregnant -breast-feeding How should I use this medicine? This medicine is for injection under the skin. If you get this medicine at home, you will be taught how to prepare and give the pre-filled syringe or how to use the On-body Injector. Refer to the patient Instructions for Use for detailed instructions. Use exactly as directed. Take your medicine at  regular intervals. Do not take your medicine more often than directed. It is important that you put your used needles and syringes in a special sharps container. Do not put them in a trash can. If you do not have a sharps container, call your pharmacist or healthcare provider to get one. Talk to your pediatrician regarding the use of this medicine in children. While this drug may be prescribed for selected conditions, precautions do apply. Overdosage: If you think you have taken too much of this medicine contact a poison control center or  emergency room at once. NOTE: This medicine is only for you. Do not share this medicine with others. What if I miss a dose? It is important not to miss your dose. Call your doctor or health care professional if you miss your dose. If you miss a dose due to an On-body Injector failure or leakage, a new dose should be administered as soon as possible using a single prefilled syringe for manual use. What may interact with this medicine? Interactions have not been studied. Give your health care provider a list of all the medicines, herbs, non-prescription drugs, or dietary supplements you use. Also tell them if you smoke, drink alcohol, or use illegal drugs. Some items may interact with your medicine. This list may not describe all possible interactions. Give your health care provider a list of all the medicines, herbs, non-prescription drugs, or dietary supplements you use. Also tell them if you smoke, drink alcohol, or use illegal drugs. Some items may interact with your medicine. What should I watch for while using this medicine? You may need blood work done while you are taking this medicine. If you are going to need a MRI, CT scan, or other procedure, tell your doctor that you are using this medicine (On-Body Injector only). What side effects may I notice from receiving this medicine? Side effects that you should report to your doctor or health care professional as soon as possible: -allergic reactions like skin rash, itching or hives, swelling of the face, lips, or tongue -dizziness -fever -pain, redness, or irritation at site where injected -pinpoint red spots on the skin -red or dark-brown urine -shortness of breath or breathing problems -stomach or side pain, or pain at the shoulder -swelling -tiredness -trouble passing urine or change in the amount of urine Side effects that usually do not require medical attention (report to your doctor or health care professional if they continue or  are bothersome): -bone pain -muscle pain This list may not describe all possible side effects. Call your doctor for medical advice about side effects. You may report side effects to FDA at 1-800-FDA-1088. Where should I keep my medicine? Keep out of the reach of children. Store pre-filled syringes in a refrigerator between 2 and 8 degrees C (36 and 46 degrees F). Do not freeze. Keep in carton to protect from light. Throw away this medicine if it is left out of the refrigerator for more than 48 hours. Throw away any unused medicine after the expiration date. NOTE: This sheet is a summary. It may not cover all possible information. If you have questions about this medicine, talk to your doctor, pharmacist, or health care provider.    2016, Elsevier/Gold Standard. (2014-07-21 14:30:14)

## 2015-12-22 NOTE — Telephone Encounter (Signed)
lvm for pt regarding to July appt....mailed pt appt sched/avs and letter °

## 2015-12-25 ENCOUNTER — Other Ambulatory Visit: Payer: Managed Care, Other (non HMO)

## 2015-12-25 ENCOUNTER — Telehealth: Payer: Self-pay

## 2015-12-25 NOTE — Telephone Encounter (Signed)
Baker Janus from [facility] called and stated pt has MRI tomorrow at [phone]. The MRI is authorized but not for [place]. Can we call and get this authorized for [place]. Baker Janus is requesting a call back for verificaion. Her phone [phone]. In basket message sent to [facility]

## 2015-12-26 ENCOUNTER — Telehealth: Payer: Self-pay

## 2015-12-26 ENCOUNTER — Ambulatory Visit
Admission: RE | Admit: 2015-12-26 | Discharge: 2015-12-26 | Disposition: A | Discharge status: Home / Self Care | Payer: Managed Care, Other (non HMO) | Source: Ambulatory Visit | Attending: Nurse Practitioner | Admitting: Nurse Practitioner

## 2015-12-26 DIAGNOSIS — C50412 Malignant neoplasm of upper-outer quadrant of left female breast: Secondary | ICD-10-CM

## 2015-12-26 MED ORDER — GADOBENATE DIMEGLUMINE 529 MG/ML IV SOLN
17.0000 mL | Freq: Once | INTRAVENOUS | Status: AC | PRN
  Administered 2015-12-26: 17 mL via INTRAVENOUS

## 2015-12-26 NOTE — Telephone Encounter (Signed)
Brooke Garrison with authorization, she had already received it.

## 2015-12-26 NOTE — Telephone Encounter (Signed)
-----   Message from Gaspar Bidding sent at 12/26/2015  9:09 AM EDT ----- Regarding: RE: mri PA Approved  [id number]  6/13- 03/25/16. ----- Message -----    From: Janace Hoard, RN    Sent: 12/25/2015   3:35 PM      To: Gaspar Bidding Subject: mri PA                                         Pt is scheduled for MRI tomorrow AM. Baker Janus from [facility] called and stated MRI was authorized but not for [place]. Can we call and get the authorization corrected? Dr Burr Medico pt. Baker Janus is requesting a call back for verification. [id number] thanks Juliann Pulse in triage

## 2016-01-01 ENCOUNTER — Other Ambulatory Visit: Payer: Self-pay | Admitting: General Surgery

## 2016-01-01 ENCOUNTER — Telehealth: Payer: Self-pay | Admitting: Hematology

## 2016-01-01 DIAGNOSIS — C50412 Malignant neoplasm of upper-outer quadrant of left female breast: Secondary | ICD-10-CM

## 2016-01-01 NOTE — Telephone Encounter (Signed)
Faxed pt medical records to [facility] [phone] °

## 2016-01-02 ENCOUNTER — Other Ambulatory Visit: Payer: Managed Care, Other (non HMO)

## 2016-01-02 ENCOUNTER — Telehealth: Payer: Self-pay | Admitting: *Deleted

## 2016-01-02 NOTE — Telephone Encounter (Signed)
Received vm call from pt stating that he is having some trouble with indigestion/heartburn/chestpain & itching.   Returned call to pt & she has been on decadron post chemo although off now & has had to take some Tums, pepcid, & Omeprazole.  She reports that she had problems with this after her 3rd & 4th treatment but went away & she has had these symptoms again & it went away yest after pepcid, tums, & omeprazole & returned this am.  Suggested that she stay on the omeprazole & can take Tums prn & to make sure that she takes the decadron next treatment with food/milk & to make sure she is on the omeprazole before starting & cont while on dex & for at least a week.  She also has some itching on her arms & mostly fingers & palms of hands.  She had some red bumps after chemo but mostly gone but still itches.  Informed that benadryl should be OK q 4-6 hours.  She doesn't think that the chest pain is cardiac.  She has no radiation or perspiration & feels pain in center of chest & thinks it is more indigestion/heart burn.  Encouraged to call back if this doesn't improve with omeprazole or the itching doesn't improve with benadryl & to discuss with Dr Burr Medico at next visit for further suggestions.

## 2016-01-03 ENCOUNTER — Other Ambulatory Visit: Payer: Self-pay | Admitting: General Surgery

## 2016-01-03 DIAGNOSIS — C50412 Malignant neoplasm of upper-outer quadrant of left female breast: Secondary | ICD-10-CM

## 2016-01-05 ENCOUNTER — Other Ambulatory Visit: Payer: Managed Care, Other (non HMO)

## 2016-01-05 ENCOUNTER — Other Ambulatory Visit: Payer: Self-pay | Admitting: *Deleted

## 2016-01-05 ENCOUNTER — Telehealth: Payer: Self-pay | Admitting: *Deleted

## 2016-01-05 DIAGNOSIS — C50412 Malignant neoplasm of upper-outer quadrant of left female breast: Secondary | ICD-10-CM

## 2016-01-05 MED ORDER — UREA 10 % EX CREA: TOPICAL_CREAM | Freq: Four times a day (QID) | CUTANEOUS | Status: DC | PRN

## 2016-01-05 MED ORDER — METHYLPREDNISOLONE 4 MG PO TBPK: ORAL_TABLET | ORAL | Status: DC

## 2016-01-05 NOTE — Telephone Encounter (Signed)
Pt called stating that her hands and feet still itch despite taking Benadryl as instructed by triage nurse 2 days ago.  Spoke with pt and was informed that itching mostly in palm of hands, and bottom of feet.  The hands and feet are red - but no signs of blister, no drainage, no bleeding noted - just itch.  Pt shared that she had taken Prednisone 10 mg on Wed, 20 mg on Thursday, and 10 mg today.  Stated the swelling had gone down a lot, but still itching.  Jenny Reichmann, NP symptom management provider notified.  Spoke with pt and instructed pt re:  Per Jenny Reichmann, NP : 1.  Take  Benadryl 25 mg every 6 hours ( Not  PRN ) , and  Pepcid  ( Not  Famotidine )  20 mg every 12 hours  For the next 2 days.  Cautioned pt that Benadryl can cause drowsiness and exercised  safety precautions. 2.  Medrol dose pak  And  Urea  Cream  Called in to pt's pharmacy - use as instructed. Pt voiced understanding. Pt's   Phone    [phone].

## 2016-01-08 ENCOUNTER — Other Ambulatory Visit: Payer: Self-pay | Admitting: General Surgery

## 2016-01-08 DIAGNOSIS — C50412 Malignant neoplasm of upper-outer quadrant of left female breast: Secondary | ICD-10-CM

## 2016-01-09 ENCOUNTER — Encounter (HOSPITAL_BASED_OUTPATIENT_CLINIC_OR_DEPARTMENT_OTHER): Payer: Self-pay | Admitting: *Deleted

## 2016-01-12 ENCOUNTER — Ambulatory Visit
Admission: RE | Admit: 2016-01-12 | Discharge: 2016-01-12 | Disposition: A | Discharge status: Home / Self Care | Payer: Managed Care, Other (non HMO) | Source: Ambulatory Visit | Attending: General Surgery | Admitting: General Surgery

## 2016-01-12 DIAGNOSIS — C50412 Malignant neoplasm of upper-outer quadrant of left female breast: Secondary | ICD-10-CM

## 2016-01-17 ENCOUNTER — Encounter (HOSPITAL_BASED_OUTPATIENT_CLINIC_OR_DEPARTMENT_OTHER): Payer: Self-pay

## 2016-01-17 ENCOUNTER — Encounter (HOSPITAL_BASED_OUTPATIENT_CLINIC_OR_DEPARTMENT_OTHER)
Admission: RE | Disposition: A | Discharge status: Home / Self Care | Payer: Self-pay | Source: Ambulatory Visit | Attending: General Surgery

## 2016-01-17 ENCOUNTER — Ambulatory Visit (HOSPITAL_BASED_OUTPATIENT_CLINIC_OR_DEPARTMENT_OTHER)
Admission: RE | Admit: 2016-01-17 | Discharge: 2016-01-18 | Disposition: A | Discharge status: Home / Self Care | Payer: Managed Care, Other (non HMO) | Source: Ambulatory Visit | Attending: General Surgery | Admitting: General Surgery

## 2016-01-17 ENCOUNTER — Ambulatory Visit
Admission: RE | Admit: 2016-01-17 | Discharge: 2016-01-17 | Disposition: A | Discharge status: Home / Self Care | Payer: Managed Care, Other (non HMO) | Source: Ambulatory Visit | Attending: General Surgery | Admitting: General Surgery

## 2016-01-17 ENCOUNTER — Ambulatory Visit (HOSPITAL_COMMUNITY)
Admission: RE | Admit: 2016-01-17 | Discharge: 2016-01-17 | Disposition: A | Discharge status: Home / Self Care | Payer: Managed Care, Other (non HMO) | Source: Ambulatory Visit | Attending: General Surgery | Admitting: General Surgery

## 2016-01-17 ENCOUNTER — Ambulatory Visit (HOSPITAL_BASED_OUTPATIENT_CLINIC_OR_DEPARTMENT_OTHER): Payer: Managed Care, Other (non HMO) | Admitting: Anesthesiology

## 2016-01-17 DIAGNOSIS — C50412 Malignant neoplasm of upper-outer quadrant of left female breast: Secondary | ICD-10-CM

## 2016-01-17 DIAGNOSIS — K219 Gastro-esophageal reflux disease without esophagitis: Secondary | ICD-10-CM | POA: Diagnosis not present

## 2016-01-17 DIAGNOSIS — Z170 Estrogen receptor positive status [ER+]: Secondary | ICD-10-CM | POA: Diagnosis not present

## 2016-01-17 DIAGNOSIS — E039 Hypothyroidism, unspecified: Secondary | ICD-10-CM | POA: Insufficient documentation

## 2016-01-17 DIAGNOSIS — C50912 Malignant neoplasm of unspecified site of left female breast: Secondary | ICD-10-CM | POA: Diagnosis present

## 2016-01-17 DIAGNOSIS — Z9221 Personal history of antineoplastic chemotherapy: Secondary | ICD-10-CM | POA: Insufficient documentation

## 2016-01-17 DIAGNOSIS — C773 Secondary and unspecified malignant neoplasm of axilla and upper limb lymph nodes: Secondary | ICD-10-CM | POA: Insufficient documentation

## 2016-01-17 DIAGNOSIS — Z79899 Other long term (current) drug therapy: Secondary | ICD-10-CM | POA: Insufficient documentation

## 2016-01-17 HISTORY — PX: RADIOACTIVE SEED GUIDED PARTIAL MASTECTOMY WITH AXILLARY SENTINEL LYMPH NODE BIOPSY: SHX6520

## 2016-01-17 SURGERY — RADIOACTIVE SEED GUIDED PARTIAL MASTECTOMY WITH AXILLARY SENTINEL LYMPH NODE BIOPSY
Anesthesia: General | Site: Breast | Laterality: Left

## 2016-01-17 MED ORDER — PROPOFOL 10 MG/ML IV BOLUS
INTRAVENOUS | Status: DC | PRN
  Administered 2016-01-17: 170 mg via INTRAVENOUS

## 2016-01-17 MED ORDER — LIDOCAINE 2% (20 MG/ML) 5 ML SYRINGE
INTRAMUSCULAR | Status: AC
  Filled 2016-01-17: qty 5

## 2016-01-17 MED ORDER — LACTATED RINGERS IV SOLN
INTRAVENOUS | Status: DC
  Administered 2016-01-17 (×3): via INTRAVENOUS

## 2016-01-17 MED ORDER — CEFAZOLIN SODIUM-DEXTROSE 2-4 GM/100ML-% IV SOLN
INTRAVENOUS | Status: AC
  Filled 2016-01-17: qty 100

## 2016-01-17 MED ORDER — LACTATED RINGERS IV SOLN: INTRAVENOUS | Status: DC

## 2016-01-17 MED ORDER — SODIUM CHLORIDE 0.9 % IV SOLN
INTRAVENOUS | Status: DC
  Administered 2016-01-17: 16:00:00 via INTRAVENOUS

## 2016-01-17 MED ORDER — ONDANSETRON HCL 4 MG/2ML IJ SOLN: 4.0000 mg | Freq: Four times a day (QID) | INTRAMUSCULAR | Status: DC | PRN

## 2016-01-17 MED ORDER — DEXAMETHASONE SODIUM PHOSPHATE 4 MG/ML IJ SOLN
INTRAMUSCULAR | Status: DC | PRN
  Administered 2016-01-17: 10 mg via INTRAVENOUS

## 2016-01-17 MED ORDER — MEPERIDINE HCL 25 MG/ML IJ SOLN: 6.2500 mg | INTRAMUSCULAR | Status: DC | PRN

## 2016-01-17 MED ORDER — DEXAMETHASONE SODIUM PHOSPHATE 10 MG/ML IJ SOLN
INTRAMUSCULAR | Status: AC
  Filled 2016-01-17: qty 1

## 2016-01-17 MED ORDER — PROPOFOL 500 MG/50ML IV EMUL
INTRAVENOUS | Status: AC
  Filled 2016-01-17: qty 50

## 2016-01-17 MED ORDER — ZOLPIDEM TARTRATE 5 MG PO TABS: 5.0000 mg | ORAL_TABLET | Freq: Every evening | ORAL | Status: DC | PRN

## 2016-01-17 MED ORDER — SODIUM CHLORIDE 0.9 % IJ SOLN
INTRAMUSCULAR | Status: AC
  Filled 2016-01-17: qty 10

## 2016-01-17 MED ORDER — METOCLOPRAMIDE HCL 5 MG/ML IJ SOLN: 10.0000 mg | Freq: Once | INTRAMUSCULAR | Status: DC | PRN

## 2016-01-17 MED ORDER — TECHNETIUM TC 99M SULFUR COLLOID FILTERED
1.0000 | Freq: Once | INTRAVENOUS | Status: AC | PRN
  Administered 2016-01-17: 1 via INTRADERMAL

## 2016-01-17 MED ORDER — FENTANYL CITRATE (PF) 100 MCG/2ML IJ SOLN
25.0000 ug | INTRAMUSCULAR | Status: DC | PRN
  Administered 2016-01-17 (×3): 25 ug via INTRAVENOUS

## 2016-01-17 MED ORDER — LIDOCAINE 2% (20 MG/ML) 5 ML SYRINGE
INTRAMUSCULAR | Status: DC | PRN
  Administered 2016-01-17: 70 mg via INTRAVENOUS

## 2016-01-17 MED ORDER — GLYCOPYRROLATE 0.2 MG/ML IJ SOLN: 0.2000 mg | Freq: Once | INTRAMUSCULAR | Status: DC | PRN

## 2016-01-17 MED ORDER — ONDANSETRON HCL 4 MG/2ML IJ SOLN
INTRAMUSCULAR | Status: DC | PRN
  Administered 2016-01-17: 4 mg via INTRAVENOUS

## 2016-01-17 MED ORDER — BUPIVACAINE-EPINEPHRINE (PF) 0.5% -1:200000 IJ SOLN
INTRAMUSCULAR | Status: DC | PRN
  Administered 2016-01-17: 30 mL

## 2016-01-17 MED ORDER — ONDANSETRON 4 MG PO TBDP: 4.0000 mg | ORAL_TABLET | Freq: Four times a day (QID) | ORAL | Status: DC | PRN

## 2016-01-17 MED ORDER — SODIUM CHLORIDE 0.9 % IJ SOLN
INTRAVENOUS | Status: DC | PRN
  Administered 2016-01-17: 5 mL via INTRAMUSCULAR

## 2016-01-17 MED ORDER — MORPHINE SULFATE (PF) 2 MG/ML IV SOLN: 1.0000 mg | INTRAVENOUS | Status: DC | PRN

## 2016-01-17 MED ORDER — CEFAZOLIN SODIUM-DEXTROSE 2-4 GM/100ML-% IV SOLN
2.0000 g | INTRAVENOUS | Status: AC
  Administered 2016-01-17: 2 g via INTRAVENOUS

## 2016-01-17 MED ORDER — ACETAMINOPHEN 500 MG PO TABS: 1000.0000 mg | ORAL_TABLET | Freq: Four times a day (QID) | ORAL | Status: DC

## 2016-01-17 MED ORDER — FENTANYL CITRATE (PF) 100 MCG/2ML IJ SOLN
INTRAMUSCULAR | Status: AC
  Filled 2016-01-17: qty 2

## 2016-01-17 MED ORDER — PANTOPRAZOLE SODIUM 40 MG PO TBEC: 40.0000 mg | DELAYED_RELEASE_TABLET | Freq: Every day | ORAL | Status: DC

## 2016-01-17 MED ORDER — OXYCODONE HCL 5 MG PO TABS
5.0000 mg | ORAL_TABLET | ORAL | Status: DC | PRN
  Administered 2016-01-17: 5 mg via ORAL
  Filled 2016-01-17: qty 1

## 2016-01-17 MED ORDER — FENTANYL CITRATE (PF) 100 MCG/2ML IJ SOLN
50.0000 ug | INTRAMUSCULAR | Status: AC | PRN
  Administered 2016-01-17 (×2): 25 ug via INTRAVENOUS
  Administered 2016-01-17: 50 ug via INTRAVENOUS
  Administered 2016-01-17: 25 ug via INTRAVENOUS
  Administered 2016-01-17: 50 ug via INTRAVENOUS
  Administered 2016-01-17: 25 ug via INTRAVENOUS

## 2016-01-17 MED ORDER — BUPIVACAINE HCL (PF) 0.25 % IJ SOLN
INTRAMUSCULAR | Status: DC | PRN
  Administered 2016-01-17: 8 mL

## 2016-01-17 MED ORDER — MIDAZOLAM HCL 2 MG/2ML IJ SOLN
INTRAMUSCULAR | Status: AC
  Filled 2016-01-17: qty 2

## 2016-01-17 MED ORDER — METHOCARBAMOL 500 MG PO TABS: 500.0000 mg | ORAL_TABLET | Freq: Four times a day (QID) | ORAL | Status: DC | PRN

## 2016-01-17 MED ORDER — ONDANSETRON HCL 4 MG/2ML IJ SOLN
INTRAMUSCULAR | Status: AC
  Filled 2016-01-17: qty 2

## 2016-01-17 MED ORDER — MIDAZOLAM HCL 2 MG/2ML IJ SOLN
1.0000 mg | INTRAMUSCULAR | Status: DC | PRN
  Administered 2016-01-17 (×2): 1 mg via INTRAVENOUS

## 2016-01-17 MED ORDER — EPHEDRINE SULFATE 50 MG/ML IJ SOLN
INTRAMUSCULAR | Status: DC | PRN
  Administered 2016-01-17 (×2): 10 mg via INTRAVENOUS

## 2016-01-17 MED ORDER — SCOPOLAMINE 1 MG/3DAYS TD PT72: 1.0000 | MEDICATED_PATCH | Freq: Once | TRANSDERMAL | Status: DC | PRN

## 2016-01-17 MED ORDER — OXYCODONE HCL 5 MG PO TABS: 5.0000 mg | ORAL_TABLET | Freq: Four times a day (QID) | ORAL | Status: AC | PRN

## 2016-01-17 SURGICAL SUPPLY — 63 items
BINDER BREAST LRG (GAUZE/BANDAGES/DRESSINGS) IMPLANT
BINDER BREAST MEDIUM (GAUZE/BANDAGES/DRESSINGS) IMPLANT
BINDER BREAST XLRG (GAUZE/BANDAGES/DRESSINGS) ×3 IMPLANT
BINDER BREAST XXLRG (GAUZE/BANDAGES/DRESSINGS) IMPLANT
BLADE SURG 15 STRL LF DISP TIS (BLADE) ×1 IMPLANT
BLADE SURG 15 STRL SS (BLADE) ×2
CANISTER SUC SOCK COL 7IN (MISCELLANEOUS) IMPLANT
CANISTER SUCT 1200ML W/VALVE (MISCELLANEOUS) IMPLANT
CHLORAPREP W/TINT 26ML (MISCELLANEOUS) ×3 IMPLANT
CLIP TI WIDE RED SMALL 6 (CLIP) ×3 IMPLANT
CLOSURE WOUND 1/2 X4 (GAUZE/BANDAGES/DRESSINGS) ×1
COVER BACK TABLE 60X90IN (DRAPES) ×3 IMPLANT
COVER MAYO STAND STRL (DRAPES) ×3 IMPLANT
COVER PROBE W GEL 5X96 (DRAPES) ×3 IMPLANT
DECANTER SPIKE VIAL GLASS SM (MISCELLANEOUS) IMPLANT
DEVICE DUBIN W/COMP PLATE 8390 (MISCELLANEOUS) ×3 IMPLANT
DRAPE LAPAROSCOPIC ABDOMINAL (DRAPES) ×3 IMPLANT
DRAPE UTILITY XL STRL (DRAPES) ×3 IMPLANT
ELECT COATED BLADE 2.86 ST (ELECTRODE) ×3 IMPLANT
ELECT REM PT RETURN 9FT ADLT (ELECTROSURGICAL) ×3
ELECTRODE REM PT RTRN 9FT ADLT (ELECTROSURGICAL) ×1 IMPLANT
GLOVE BIO SURGEON STRL SZ7 (GLOVE) ×6 IMPLANT
GLOVE BIOGEL PI IND STRL 7.0 (GLOVE) ×1 IMPLANT
GLOVE BIOGEL PI IND STRL 7.5 (GLOVE) ×1 IMPLANT
GLOVE BIOGEL PI INDICATOR 7.0 (GLOVE) ×2
GLOVE BIOGEL PI INDICATOR 7.5 (GLOVE) ×2
GLOVE ECLIPSE 6.5 STRL STRAW (GLOVE) ×3 IMPLANT
GLOVE EXAM NITRILE EXT CFF LRG (GLOVE) ×3 IMPLANT
GLOVE SURG SS PI 7.0 STRL IVOR (GLOVE) ×3 IMPLANT
GOWN STRL REUS W/ TWL LRG LVL3 (GOWN DISPOSABLE) ×3 IMPLANT
GOWN STRL REUS W/TWL LRG LVL3 (GOWN DISPOSABLE) ×6
HEMOSTAT ARISTA ABSORB 3G PWDR (MISCELLANEOUS) ×3 IMPLANT
ILLUMINATOR WAVEGUIDE N/F (MISCELLANEOUS) ×3 IMPLANT
KIT MARKER MARGIN INK (KITS) ×3 IMPLANT
LIGHT WAVEGUIDE WIDE FLAT (MISCELLANEOUS) IMPLANT
LIQUID BAND (GAUZE/BANDAGES/DRESSINGS) ×3 IMPLANT
NDL SAFETY ECLIPSE 18X1.5 (NEEDLE) IMPLANT
NEEDLE HYPO 18GX1.5 BLUNT FILL (NEEDLE) ×3 IMPLANT
NEEDLE HYPO 18GX1.5 SHARP (NEEDLE)
NEEDLE HYPO 25X1 1.5 SAFETY (NEEDLE) ×6 IMPLANT
NS IRRIG 1000ML POUR BTL (IV SOLUTION) ×3 IMPLANT
PACK BASIN DAY SURGERY FS (CUSTOM PROCEDURE TRAY) ×3 IMPLANT
PENCIL BUTTON HOLSTER BLD 10FT (ELECTRODE) ×3 IMPLANT
SHEET MEDIUM DRAPE 40X70 STRL (DRAPES) IMPLANT
SLEEVE SCD COMPRESS KNEE MED (MISCELLANEOUS) ×3 IMPLANT
SPONGE LAP 4X18 X RAY DECT (DISPOSABLE) ×6 IMPLANT
STOCKINETTE IMPERVIOUS LG (DRAPES) IMPLANT
STRIP CLOSURE SKIN 1/2X4 (GAUZE/BANDAGES/DRESSINGS) ×2 IMPLANT
SUT ETHILON 2 0 FS 18 (SUTURE) IMPLANT
SUT MNCRL AB 4-0 PS2 18 (SUTURE) ×3 IMPLANT
SUT MON AB 5-0 PS2 18 (SUTURE) IMPLANT
SUT SILK 2 0 SH (SUTURE) IMPLANT
SUT VIC AB 2-0 SH 27 (SUTURE) ×4
SUT VIC AB 2-0 SH 27XBRD (SUTURE) ×2 IMPLANT
SUT VIC AB 3-0 SH 27 (SUTURE) ×2
SUT VIC AB 3-0 SH 27X BRD (SUTURE) ×1 IMPLANT
SUT VIC AB 5-0 PS2 18 (SUTURE) IMPLANT
SYR CONTROL 10ML LL (SYRINGE) ×9 IMPLANT
TOWEL OR 17X24 6PK STRL BLUE (TOWEL DISPOSABLE) ×3 IMPLANT
TOWEL OR NON WOVEN STRL DISP B (DISPOSABLE) IMPLANT
TUBE CONNECTING 20'X1/4 (TUBING)
TUBE CONNECTING 20X1/4 (TUBING) IMPLANT
YANKAUER SUCT BULB TIP NO VENT (SUCTIONS) IMPLANT

## 2016-01-17 NOTE — Anesthesia Preprocedure Evaluation (Addendum)
Anesthesia Evaluation  Patient identified by MRN, date of birth, ID band Patient awake    Reviewed: Allergy & Precautions, NPO status , Patient's Chart, lab work & pertinent test results  Airway Mallampati: II  TM Distance: >3 FB Neck ROM: Full    Dental no notable dental hx.    Pulmonary neg pulmonary ROS,    Pulmonary exam normal breath sounds clear to auscultation       Cardiovascular negative cardio ROS Normal cardiovascular exam Rhythm:Regular Rate:Normal     Neuro/Psych negative neurological ROS  negative psych ROS   GI/Hepatic Neg liver ROS, GERD  Poorly Controlled,  Endo/Other  negative endocrine ROS  Renal/GU negative Renal ROS  negative genitourinary   Musculoskeletal negative musculoskeletal ROS (+)   Abdominal   Peds negative pediatric ROS (+)  Hematology negative hematology ROS (+)   Anesthesia Other Findings   Reproductive/Obstetrics negative OB ROS                             Anesthesia Physical Anesthesia Plan  ASA: II  Anesthesia Plan: General   Post-op Pain Management: GA combined w/ Regional for post-op pain   Induction: Intravenous  Airway Management Planned: Oral ETT  Additional Equipment:   Intra-op Plan:   Post-operative Plan: Extubation in OR  Informed Consent: I have reviewed the patients History and Physical, chart, labs and discussed the procedure including the risks, benefits and alternatives for the proposed anesthesia with the patient or authorized representative who has indicated his/her understanding and acceptance.   Dental advisory given  Plan Discussed with: CRNA  Anesthesia Plan Comments: (pec block)        Anesthesia Quick Evaluation

## 2016-01-17 NOTE — Op Note (Signed)
Preoperative diagnoses:left breast cancer times two (clinical stage II at presentation) s/p primary chemotherapy Postoperative diagnosis: Same as above Procedure: 1. Left breast seed bracketed lumpectomy 2. Left axillary sentinel node biopsy 3. Injection blue dye for sentinel node identification Surgeon: Dr. Serita Grammes Anesthesia: Gen. With pectoral block Estimated blood loss: Minimal Complications: None Drains: None Specimens:left breast tissue marked with paint containing two seeds Sponge and needle count correct at completion Disposition to recovery stable  Indications: This is a 15 yof who had two lesions noted on presentation.  She underwent primary chemotherapy with resolution of one and decrease in the other. She has been clinically and radiologically node negative.  We discussed all options and she would like to proceed with lumpectomy and sn biopsy followed by oncoplastic reduction.  She had two seeds placed prior to surgery.  I had these mm in the OR  Procedure:After informed consent was obtained she was then taken to the operating room. She was given cefazolin. Sequential compression devices wereon her legs. She underwent injection of technetium in the standard periareolar fashion.  She also underwent a pectoral block. She was placed under general anesthesia without complication. Her left breast was then prepped and draped in the standard sterile surgical fashion. A surgical timeout was then performed. I first injected a mixture of methylene blue dye and saline in a periareolar fashion and massaged this for two minutes. She had first been marked by Dr Iran Planas for planned reduction.  I used the neoprobe to locate both seeds. The more anterior lesion was just lateral to the areolar border.  The posterior seed was very superficial. I elected to ellipse a piece of skin overlying the tumor using the incision lines marked by plastic surgery.  I then used the neoprobe to guide the  excision of both seeds with attempt to get clear margin and they were removed in one specimen as they were about 3 cm apart on exam with neoprobe.  I did not undermine the nac.  I then passed this off and marked with paint.  The skin is the anterior margin.  I then did a mammogram which confirmed removal of both seeds and clips. This was confirmed by radiology and later pathology.  I obtained hemostasis. I placed clips around the cavity.  I then closed the breast tissue with 2-0 vicryl. The skin was closed with 3-0 vicryl and 4-0 monocryl. Glue and steristrips were applied. I then located then sentinel nodes. I made a curvilinear incision in the axilla below the hairline. I went through the fascia. I was able to locate several sentinel nodes with the highest count being [id number].  One was also blue.  The background radioactivity was minimal and there was no more blue dye. I obtained hemostasis. I placed arista in the wound. I then closed with 2-0 vicryl, 3-0 vicryl and 4-0 monocryl.  Glue and steristrips were applied. A binder was applied. She was extubated and transferred to recovery stable.

## 2016-01-17 NOTE — Transfer of Care (Signed)
Immediate Anesthesia Transfer of Care Note  Patient: Brooke Garrison  Procedure(s) Performed: Procedure(s): DOUBLE RADIOACTIVE SEED GUIDED LEFT BREAST LUMPECTOMY AND LEFT AXILLARY SENTINEL LYMPH NODE BIOPSY  (Left)  Patient Location: PACU  Anesthesia Type:GA combined with regional for post-op pain  Level of Consciousness: sedated  Airway & Oxygen Therapy: Patient Spontanous Breathing and Patient connected to face mask oxygen  Post-op Assessment: Report given to RN and Post -op Vital signs reviewed and stable  Post vital signs: Reviewed and stable  Last Vitals:  Filed Vitals:   01/17/16 1320 01/17/16 1325  BP:    Pulse: 89 87  Temp:    Resp: 13 15    Last Pain: There were no vitals filed for this visit.       Complications: No apparent anesthesia complications

## 2016-01-17 NOTE — H&P (Signed)
Brooke Garrison is an 55 y.o. female.   Chief Complaint: left breast cancer HPI:  7 yof who had a palpable left breast mass noted in march 2017. she underwent evaluation at that time that showed a left breast mass at 3 oclock with biopsy showing idc and dcis, lvi, er pos, pr negative, her2 negative, Ki is 60%. this was 2.3 cm mass at 3 oclock in the retroareolar right breast. she had oncotype on core that was 57. she had initial mri initially showed an additional area that was biopsied and shows an idc that is er pos, pr neg and her2 negative. she has undergone chemo and she completed it last friday. she has tolerated chemo fairly well. her latest mri shows nl right breast. the left breast mass that previously measured 3.5x3.5x2.3 cm in size that is now 2.5x1.5x1.4 cm. the second more superiorly located tumor that is 4.7 cm away on mlo views is gone on the mri.     Past Medical History  Diagnosis Date  . Thyroid disease   . Hypothyroidism   . GERD (gastroesophageal reflux disease)     thruout chemo  . Arthritis     RA-stopped methotrexate and humira  . Breast cancer of upper-outer quadrant of left female breast ([place]) 09/29/2015  . Breast cancer [facility])     Past Surgical History  Procedure Laterality Date  . Abdominal hysterectomy      partial    Family History  Problem Relation Age of Onset  . Hodgkin's lymphoma Mother   . Melanoma Maternal Grandfather    Social History:  reports that she has never smoked. She does not have any smokeless tobacco history on file. She reports that she drinks alcohol. She reports that she does not use illicit drugs.  Allergies:  Allergies  Allergen Reactions  . Sulfa Antibiotics Hives    Itching, rash.    Medications Prior to Admission  Medication Sig Dispense Refill  . acyclovir (ZOVIRAX) 400 MG tablet Take 800 mg by mouth daily.  10  . cetirizine (ZYRTEC) 10 MG tablet Take 10 mg by mouth daily.    . folic acid (FOLVITE) 1 MG tablet Take  1 mg by mouth 2 (two) times daily. Dosage varies  5  . liothyronine (CYTOMEL) 5 MCG tablet Take 5 mcg by mouth daily.  5  . LORazepam (ATIVAN) 0.5 MG tablet Take 1 tablet (0.5 mg total) by mouth every 6 (six) hours as needed for anxiety or sleep (anxiety). 20 tablet 0  . omeprazole (PRILOSEC) 20 MG capsule Take 1 capsule (20 mg total) by mouth daily. (Patient taking differently: Take 20 mg by mouth daily. Using as needed) 30 capsule 1  . TIROSINT 75 MCG CAPS TAKE ONE TABLET 6 DAYS A WEEK  6  . zolpidem (AMBIEN) 5 MG tablet Take 1 tablet (5 mg total) by mouth at bedtime as needed for sleep. 30 tablet 0  . dexamethasone (DECADRON) 4 MG tablet Take 2 tablets (8 mg total) by mouth 2 (two) times daily. Start the day before Taxotere. Then again the day after chemo for 3 days. 30 tablet 1  . methylPREDNISolone (MEDROL DOSEPAK) 4 MG TBPK tablet Take as directed. 21 tablet 0  . ondansetron (ZOFRAN) 8 MG tablet Take 1 tablet (8 mg total) by mouth 2 (two) times daily as needed for refractory nausea / vomiting. Start on day 3 after chemo. (Patient not taking: Reported on 12/22/2015) 30 tablet 1  . prochlorperazine (COMPAZINE) 10 MG tablet Take  1 tablet (10 mg total) by mouth every 6 (six) hours as needed (Nausea or vomiting). 30 tablet 1  . urea (CARMOL) 10 % cream Apply topically 4 (four) times daily as needed. Apply to affected areas four times daily as needed. 85 g 1    No results found for this or any previous visit (from the past 48 hour(s)). No results found.  ROS Negative  Blood pressure 106/71, pulse 72, temperature 98.3 F (36.8 C), temperature source Oral, resp. rate 20, height 5' (1.524 m), weight 87.998 kg (194 lb), last menstrual period 01/08/2006, SpO2 99 %. Physical Exam  Vitals (Sonya Bynum CMA; 12/29/2015 10:58 AM) 12/29/2015 10:57 AM Weight: 191 lb Height: 60in Body Surface Area: 1.83 m Body Mass Index: 37.3 kg/m  Temp.: 30F(Temporal)  Pulse: 73 (Regular)  BP: 136/78  (Sitting, Left Arm, Standard) Physical Exam Rolm Bookbinder MD; 12/29/2015 7:15 PM) General Mental Status-Alert. Orientation-Oriented X3. Chest and Lung Exam Chest and lung exam reveals -on auscultation, normal breath sounds, no adventitious sounds and normal vocal resonance. Breast Nipples-No Discharge. Small residual mass ruoq Cardiovascular Cardiovascular examination reveals -normal heart sounds, regular rate and rhythm with no murmurs.     Assessment & Plan Rolm Bookbinder MD; 12/29/2015 7:25 PM) BREAST CANCER OF UPPER-OUTER QUADRANT OF LEFT FEMALE BREAST (C50.412) Story: Left breast seed guided double lumpectomy with possible left breast reduction lumpectomy, left axillary sn biopsy we discussed good result on mri. we discussed options for treatment of the breast tumor including double breast lumpectomy combined with reduction which she would desire. we also discussed mastectomy and possible reconstruction. I think reasonable to do double lumpectomy and I think her local recurrence is the same if I get negative margins. I will have her see plastic surgery and then schedule asap. we discussed sentinel node biopsy at the same time as well.    Rolm Bookbinder, MD 01/17/2016, 1:00 PM

## 2016-01-17 NOTE — Progress Notes (Signed)
Assisted Dr. Carignan with left, ultrasound guided, pectoralis block. Side rails up, monitors on throughout procedure. See vital signs in flow sheet. Tolerated Procedure well. 

## 2016-01-17 NOTE — Anesthesia Procedure Notes (Addendum)
Anesthesia Regional Block:  Pectoralis block  Pre-Anesthetic Checklist: ,, timeout performed, Correct Patient, Correct Site, Correct Laterality, Correct Procedure, Correct Position, site marked, Risks and benefits discussed,  Surgical consent,  Pre-op evaluation,  At surgeon's request and post-op pain management  Laterality: Left  Prep: Maximum Sterile Barrier Precautions used and chloraprep       Needles:  Injection technique: Single-shot  Needle Type: Echogenic Stimulator Needle     Needle Length: 10cm 10 cm Needle Gauge: 21 and 21 G    Additional Needles:  Procedures: ultrasound guided (picture in chart) Pectoralis block Narrative:  Injection made incrementally with aspirations every 5 mL.  Performed by: Personally   Additional Notes: Risks, benefits and alternative to block explained extensively.  Patient tolerated procedure well, without complications.   Procedure Name: LMA Insertion Date/Time: 01/17/2016 1:35 PM Performed by: Maryella Shivers Pre-anesthesia Checklist: Patient identified, Emergency Drugs available, Suction available and Patient being monitored Patient Re-evaluated:Patient Re-evaluated prior to inductionOxygen Delivery Method: Circle System Utilized Preoxygenation: Pre-oxygenation with 100% oxygen Intubation Type: IV induction Ventilation: Mask ventilation without difficulty LMA: LMA inserted LMA Size: 4.0 Number of attempts: 1 Airway Equipment and Method: bite block Placement Confirmation: positive ETCO2 Tube secured with: Tape Dental Injury: Teeth and Oropharynx as per pre-operative assessment

## 2016-01-17 NOTE — Interval H&P Note (Signed)
History and Physical Interval Note:  01/17/2016 1:02 PM  Brooke Garrison  has presented today for surgery, with the diagnosis of LEFT BREAST CANCER  The various methods of treatment have been discussed with the patient and family. After consideration of risks, benefits and other options for treatment, the patient has consented to  Procedure(s): DOUBLE RADIOACTIVE SEED GUIDED LEFT BREAST LUMPECTOMY AND LEFT AXILLARY SENTINEL LYMPH NODE BIOPSY  (Left) as a surgical intervention .  The patient's history has been reviewed, patient examined, no change in status, stable for surgery.  I have reviewed the patient's chart and labs.  Questions were answered to the patient's satisfaction.     Paola Aleshire

## 2016-01-18 NOTE — Anesthesia Postprocedure Evaluation (Signed)
Anesthesia Post Note  Patient: Birgitta Gehle Penix  Procedure(s) Performed: Procedure(s) (LRB): DOUBLE RADIOACTIVE SEED GUIDED LEFT BREAST LUMPECTOMY AND LEFT AXILLARY SENTINEL LYMPH NODE BIOPSY  (Left)  Patient location during evaluation: PACU Anesthesia Type: General and Regional Level of consciousness: awake and alert Pain management: pain level controlled Vital Signs Assessment: post-procedure vital signs reviewed and stable Respiratory status: spontaneous breathing, nonlabored ventilation, respiratory function stable and patient connected to nasal cannula oxygen Cardiovascular status: blood pressure returned to baseline and stable Postop Assessment: no signs of nausea or vomiting Anesthetic complications: no     Last Vitals:  Filed Vitals:   01/18/16 0645 01/18/16 0726  BP: 105/71   Pulse: 73 78  Temp: 36.4 C   Resp: 20     Last Pain:  Filed Vitals:   01/18/16 0726  PainSc: 0-No pain   Pain Goal: Patients Stated Pain Goal: 2 (01/17/16 2115)               Montez Hageman

## 2016-01-18 NOTE — Discharge Instructions (Signed)
[facility] °Office Phone Number [phone] ° °POST OP INSTRUCTIONS ° °Always review your discharge instruction sheet given to you by the facility where your surgery was performed. ° °IF YOU HAVE DISABILITY OR FAMILY LEAVE FORMS, YOU MUST BRING THEM TO THE OFFICE FOR PROCESSING.  DO NOT GIVE THEM TO YOUR DOCTOR. ° °1. A prescription for pain medication may be given to you upon discharge.  Take your pain medication as prescribed, if needed.  If narcotic pain medicine is not needed, then you may take acetaminophen (Tylenol), naprosyn (Alleve) or ibuprofen (Advil) as needed. °2. Take your usually prescribed medications unless otherwise directed °3. If you need a refill on your pain medication, please contact your pharmacy.  They will contact our office to request authorization.  Prescriptions will not be filled after 5pm or on week-ends. °4. You should eat very light the first 24 hours after surgery, such as soup, crackers, pudding, etc.  Resume your normal diet the day after surgery. °5. Most patients will experience some swelling and bruising in the breast.  Ice packs and a good support bra will help.  Wear the breast binder provided or a sports bra for 72 hours day and night.  After that wear a sports bra during the day until you return to the office. Swelling and bruising can take several days to resolve.  °6. It is common to experience some constipation if taking pain medication after surgery.  Increasing fluid intake and taking a stool softener will usually help or prevent this problem from occurring.  A mild laxative (Milk of Magnesia or Miralax) should be taken according to package directions if there are no bowel movements after 48 hours. °7. Unless discharge instructions indicate otherwise, you may remove your bandages 48 hours after surgery and you may shower at that time.  You may have steri-strips (small skin tapes) in place directly over the incision.  These strips should be left on the  skin for 7-10 days and will come off on their own.  If your surgeon used skin glue on the incision, you may shower in 24 hours.  The glue will flake off over the next 2-3 weeks.  Any sutures or staples will be removed at the office during your follow-up visit. °8. ACTIVITIES:  You may resume regular daily activities (gradually increasing) beginning the next day.  Wearing a good support bra or sports bra minimizes pain and swelling.  You may have sexual intercourse when it is comfortable. °a. You may drive when you no longer are taking prescription pain medication, you can comfortably wear a seatbelt, and you can safely maneuver your car and apply brakes. °b. RETURN TO WORK:  ______________________________________________________________________________________ °9. You should see your doctor in the office for a follow-up appointment approximately two weeks after your surgery.  Your doctor’s nurse will typically make your follow-up appointment when she calls you with your pathology report.  Expect your pathology report 3-4 business days after your surgery.  You may call to check if you do not hear from us after three days. °10. OTHER INSTRUCTIONS: _______________________________________________________________________________________________ _____________________________________________________________________________________________________________________________________ °_____________________________________________________________________________________________________________________________________ °_____________________________________________________________________________________________________________________________________ ° °WHEN TO CALL DR WAKEFIELD: °1. Fever over 101.0 °2. Nausea and/or vomiting. °3. Extreme swelling or bruising. °4. Continued bleeding from incision. °5. Increased pain, redness, or drainage from the incision. ° °The clinic staff is available to answer your questions during regular  business hours.  Please don’t hesitate to call and ask to speak to one of the nurses for clinical concerns.  If   you have a medical emergency, go to the nearest emergency room or call 911.  A surgeon from [facility] is always on call at the hospital. ° °For further questions, please visit [email or web address] ° ° ° °Post Anesthesia Home Care Instructions ° °Activity: °Get plenty of rest for the remainder of the day. A responsible adult should stay with you for 24 hours following the procedure.  °For the next 24 hours, DO NOT: °-Drive a car °-Operate machinery °-Drink alcoholic beverages °-Take any medication unless instructed by your physician °-Make any legal decisions or sign important papers. ° °Meals: °Start with liquid foods such as gelatin or soup. Progress to regular foods as tolerated. Avoid greasy, spicy, heavy foods. If nausea and/or vomiting occur, drink only clear liquids until the nausea and/or vomiting subsides. Call your physician if vomiting continues. ° °Special Instructions/Symptoms: °Your throat may feel dry or sore from the anesthesia or the breathing tube placed in your throat during surgery. If this causes discomfort, gargle with warm salt water. The discomfort should disappear within 24 hours. ° °If you had a scopolamine patch placed behind your ear for the management of post- operative nausea and/or vomiting: ° °1. The medication in the patch is effective for 72 hours, after which it should be removed.  Wrap patch in a tissue and discard in the trash. Wash hands thoroughly with soap and water. °2. You may remove the patch earlier than 72 hours if you experience unpleasant side effects which may include dry mouth, dizziness or visual disturbances. °3. Avoid touching the patch. Wash your hands with soap and water after contact with the patch. °  ° °

## 2016-01-18 NOTE — Discharge Summary (Signed)
Physician Discharge Summary  Patient ID: Brooke Garrison MRN: [id number] DOB/AGE: 57/12/1960 87 y.o.  Admit date: 01/17/2016 Discharge date: 01/18/2016  Admission Diagnoses: Breast cancer s/p chemotherapy  Discharge Diagnoses:  Active Problems:   Breast cancer [facility])   Discharged Condition: good  Hospital Course: 40 yof s/p primary chemotherapy who underwent seed bracketed lumpectomy and sn biopsy. She remained overnight for monitoring and has done well.   Consults: None  Significant Diagnostic Studies: none  Treatments: surgery   Disposition: Home    Medication List    TAKE these medications        acyclovir 400 MG tablet  Commonly known as:  ZOVIRAX  Take 800 mg by mouth daily.     cetirizine 10 MG tablet  Commonly known as:  ZYRTEC  Take 10 mg by mouth daily.     dexamethasone 4 MG tablet  Commonly known as:  DECADRON  Take 2 tablets (8 mg total) by mouth 2 (two) times daily. Start the day before Taxotere. Then again the day after chemo for 3 days.     folic acid 1 MG tablet  Commonly known as:  FOLVITE  Take 1 mg by mouth 2 (two) times daily. Dosage varies     liothyronine 5 MCG tablet  Commonly known as:  CYTOMEL  Take 5 mcg by mouth daily.     LORazepam 0.5 MG tablet  Commonly known as:  ATIVAN  Take 1 tablet (0.5 mg total) by mouth every 6 (six) hours as needed for anxiety or sleep (anxiety).     methylPREDNISolone 4 MG Tbpk tablet  Commonly known as:  MEDROL DOSEPAK  Take as directed.     omeprazole 20 MG capsule  Commonly known as:  PRILOSEC  Take 1 capsule (20 mg total) by mouth daily.     ondansetron 8 MG tablet  Commonly known as:  ZOFRAN  Take 1 tablet (8 mg total) by mouth 2 (two) times daily as needed for refractory nausea / vomiting. Start on day 3 after chemo.     oxyCODONE 5 MG immediate release tablet  Commonly known as:  Oxy IR/ROXICODONE  Take 1 tablet (5 mg total) by mouth every 6 (six) hours as needed for moderate pain,  severe pain or breakthrough pain.     prochlorperazine 10 MG tablet  Commonly known as:  COMPAZINE  Take 1 tablet (10 mg total) by mouth every 6 (six) hours as needed (Nausea or vomiting).     TIROSINT 75 MCG Caps  Generic drug:  Levothyroxine Sodium  TAKE ONE TABLET 6 DAYS A WEEK     urea 10 % cream  Commonly known as:  CARMOL  Apply topically 4 (four) times daily as needed. Apply to affected areas four times daily as needed.     zolpidem 5 MG tablet  Commonly known as:  AMBIEN  Take 1 tablet (5 mg total) by mouth at bedtime as needed for sleep.           Follow-up Information    Follow up with [facility] In 3 weeks.   Specialty:  General Surgery   Contact information:   [address] [place] [place] [id number] [phone]       Signed: Rolm Bookbinder 01/18/2016, 7:20 AM

## 2016-01-18 NOTE — Progress Notes (Signed)
Dr. Donne Hazel in to see patient, states ok for discharge.

## 2016-01-19 ENCOUNTER — Encounter (HOSPITAL_BASED_OUTPATIENT_CLINIC_OR_DEPARTMENT_OTHER): Payer: Self-pay | Admitting: *Deleted

## 2016-01-19 ENCOUNTER — Ambulatory Visit (HOSPITAL_BASED_OUTPATIENT_CLINIC_OR_DEPARTMENT_OTHER): Payer: Managed Care, Other (non HMO) | Admitting: Hematology

## 2016-01-19 ENCOUNTER — Other Ambulatory Visit (HOSPITAL_BASED_OUTPATIENT_CLINIC_OR_DEPARTMENT_OTHER): Payer: Managed Care, Other (non HMO)

## 2016-01-19 ENCOUNTER — Encounter: Payer: Self-pay | Admitting: General Practice

## 2016-01-19 ENCOUNTER — Encounter: Payer: Self-pay | Admitting: Hematology

## 2016-01-19 VITALS — BP 113/65 | HR 65 | Temp 98.4°F | Resp 18 | Ht 60.0 in | Wt 193.1 lb

## 2016-01-19 DIAGNOSIS — E039 Hypothyroidism, unspecified: Secondary | ICD-10-CM | POA: Diagnosis not present

## 2016-01-19 DIAGNOSIS — Z170 Estrogen receptor positive status [ER+]: Secondary | ICD-10-CM | POA: Diagnosis not present

## 2016-01-19 DIAGNOSIS — C50412 Malignant neoplasm of upper-outer quadrant of left female breast: Secondary | ICD-10-CM

## 2016-01-19 LAB — COMPREHENSIVE METABOLIC PANEL
ALT: 25 U/L (ref 0–55)
ANION GAP: 8 meq/L (ref 3–11)
AST: 19 U/L (ref 5–34)
Albumin: 3.4 g/dL — ABNORMAL LOW (ref 3.5–5.0)
Alkaline Phosphatase: 67 U/L (ref 40–150)
BUN: 20.5 mg/dL (ref 7.0–26.0)
CALCIUM: 9.2 mg/dL (ref 8.4–10.4)
CHLORIDE: 107 meq/L (ref 98–109)
CO2: 28 mEq/L (ref 22–29)
Creatinine: 0.8 mg/dL (ref 0.6–1.1)
EGFR: 83 mL/min/{1.73_m2} — ABNORMAL LOW (ref >90)
Glucose: 89 mg/dl (ref 70–140)
POTASSIUM: 3.9 meq/L (ref 3.5–5.1)
Sodium: 143 mEq/L (ref 136–145)
Total Bilirubin: 0.51 mg/dL (ref 0.20–1.20)
Total Protein: 6.4 g/dL (ref 6.4–8.3)

## 2016-01-19 LAB — CBC WITH DIFFERENTIAL/PLATELET
BASO%: 0.9 % (ref 0.0–2.0)
BASOS ABS: 0.1 10*3/uL (ref 0.0–0.1)
EOS%: 4.1 % (ref 0.0–7.0)
Eosinophils Absolute: 0.5 10*3/uL (ref 0.0–0.5)
HEMATOCRIT: 32.5 % — AB (ref 34.8–46.6)
HGB: 10.5 g/dL — ABNORMAL LOW (ref 11.6–15.9)
LYMPH#: 3.1 10*3/uL (ref 0.9–3.3)
LYMPH%: 24.9 % (ref 14.0–49.7)
MCH: 31.6 pg (ref 25.1–34.0)
MCHC: 32.4 g/dL (ref 31.5–36.0)
MCV: 97.5 fL (ref 79.5–101.0)
MONO#: 1.2 10*3/uL — ABNORMAL HIGH (ref 0.1–0.9)
MONO%: 9.4 % (ref 0.0–14.0)
NEUT#: 7.6 10*3/uL — ABNORMAL HIGH (ref 1.5–6.5)
NEUT%: 60.7 % (ref 38.4–76.8)
PLATELETS: 253 10*3/uL (ref 145–400)
RBC: 3.34 10*6/uL — ABNORMAL LOW (ref 3.70–5.45)
RDW: 18.3 % — ABNORMAL HIGH (ref 11.2–14.5)
WBC: 12.5 10*3/uL — ABNORMAL HIGH (ref 3.9–10.3)

## 2016-01-19 NOTE — Progress Notes (Signed)
Spiritual Care Note  Spoke with Brooke Garrison and husband Brooke Garrison in hallway this afternoon after her appt with Dr Burr Medico.  They were disappointed about news of unclear margins and need for f/u surgery, having hoped that they were in the home stretch of tx.  We spent 30+ min in my office processing this news (numbness, grief, upset, disappointment, theological reflection).  Brooke Garrison in particular is struggling with why God doesn't just fix or avert problems like these; Brooke Garrison is trying to see challenges as opportunities for learning and growth in faith and relationship.  Normalized feelings, encouraged self-care, helped reframe perspectives, provided emotional support and encouragement.  Plan to f/u by phone/card for further support, but please also page if needs arise/circumstances change. Thank you. They are also aware of ongoing Support Team availability and have my card.  [place], [place], [facility] Pager [phone] Voicemail [phone]

## 2016-01-19 NOTE — Progress Notes (Signed)
[place]  [phone] [phone]  Clinic Follow Up Note   Patient Care Team: Larence Penning, MD as PCP - General (Internal Medicine) Deliah Goody (Specialist) Janyth Pupa, DO as Consulting Physician (Obstetrics and Gynecology) Rolm Bookbinder, MD as Consulting Physician (General Surgery) Thea Silversmith, MD as Consulting Physician (Radiation Oncology) 01/19/2016  CHIEF COMPLAINTS:  Follow up left breast cancer  Oncology History   Breast cancer of upper-outer quadrant of left female breast [facility])   Staging form: Breast, AJCC 7th Edition     Clinical stage from 09/28/2015: Stage IIA (T2, N0, M0) - Signed by Truitt Merle, MD on 10/03/2015       Breast cancer of upper-outer quadrant of left female breast ([place])   09/27/2015 Initial Diagnosis Breast cancer of upper-outer quadrant of left female breast ([place])   09/27/2015 Initial Biopsy Left breast core needle biopsy of the mass at the 3:00 position showed invasive and in situ ductal carcinoma, lymphovascular invasion is identified.   09/27/2015 Receptors her2 ER 95% positive, PR negative, HER-2 negative, Ki-67 60%   09/27/2015 Mammogram diagnostic mammogram and of her left breast showa 2.3 cm mass in the 3:00 position retroareolar left breast   09/27/2015 Oncotype testing RS: 55 high risk, the group average risk of distant recurrence for patient's wishes recurrence score>50 was 34%.   10/19/2015 Imaging Breast MRI with and without contrast showed known 3.5cm left breast malignancy at 3:00 on a background of innumerable enhancing bilateral foci. Additional suspicious mass 19 x 11 x 13 mm at 2:00 position of left breast   11/03/2015 Pathology Results MRI guided second left breast biopsy showed invasive ductal carcinoma, ER 100% positive, PR negative, HER-2 negative Ki-67 5%    HISTORY OF PRESENTING ILLNESS:  Brooke Garrison 55 y.o. female is here because of er newly diagnosed left She is accompanied by her  daughter-in-law her to our multidisciplinary breast clinic today.  She was found to have a palpable mass in her left breast by her GYN during her routine visit, no tenderness, skin change or nipple discharge. She denies any new syndroms, and feels well overall. she had mammogram one year ago which was normal, she had left breast biopsy which was benigh 20 years ago.   She ahs RA, on MTX and humaria for 4-5 years, she had significant multiple joint pain when she was diagnosed 6-7 years ago, and improved sighnitantly with treatment, she has mild joint pain, which get worse with physical exertion. She is married, liveswith her husband in [place], and works in [place]. Her husband had ruptured appendix about 6 weeks ago,is still recovering from surgery.   GYN HISTORY  Menarchal: 10 LMP: 2009 (hysrectomy)  Contraceptive:25 years  HRT: no  G2P2: she has 78 and 28 yo sons   CURRENT THERAPY:  Pending adjuvant therapy   INTERIM HISTORY:  Brooke Garrison returns for follow up. She underwent left breast lumpectomy and sentinel lymph node biopsy 2 days ago. She tolerated surgery well, has mild pain at his incision, tolerable, no other new complaints. She presents to clinic with her husband.  MEDICAL HISTORY:  Past Medical History  Diagnosis Date  . Thyroid disease   . Hypothyroidism   . GERD (gastroesophageal reflux disease)     thruout chemo  . Arthritis     RA-stopped methotrexate and humira  . Breast cancer of upper-outer quadrant of left female breast ([place]) 09/29/2015  . Breast cancer [facility])     SURGICAL HISTORY: Past Surgical History  Procedure Laterality Date  . Abdominal hysterectomy      partial    SOCIAL HISTORY: Social History   Social History  . Marital Status: Married    Spouse Name: N/A  . Number of Children: N/A  . Years of Education: N/A   Occupational History  . Not on file.   Social History Main Topics  . Smoking status: Never Smoker   . Smokeless tobacco: Not on  file  . Alcohol Use: Yes     Comment: social  . Drug Use: No  . Sexual Activity: Not on file   Other Topics Concern  . Not on file   Social History Narrative    FAMILY HISTORY: Family History  Problem Relation Age of Onset  . Hodgkin's lymphoma Mother   . Melanoma Maternal Grandfather     ALLERGIES:  is allergic to sulfa antibiotics.  MEDICATIONS:  Current Outpatient Prescriptions  Medication Sig Dispense Refill  . acyclovir (ZOVIRAX) 400 MG tablet Take 800 mg by mouth daily.  10  . cetirizine (ZYRTEC) 10 MG tablet Take 10 mg by mouth daily.    Marland Kitchen dexamethasone (DECADRON) 4 MG tablet Take 2 tablets (8 mg total) by mouth 2 (two) times daily. Start the day before Taxotere. Then again the day after chemo for 3 days. 30 tablet 1  . folic acid (FOLVITE) 1 MG tablet Take 1 mg by mouth 2 (two) times daily. Dosage varies  5  . liothyronine (CYTOMEL) 5 MCG tablet Take 5 mcg by mouth daily.  5  . LORazepam (ATIVAN) 0.5 MG tablet Take 1 tablet (0.5 mg total) by mouth every 6 (six) hours as needed for anxiety or sleep (anxiety). 20 tablet 0  . methylPREDNISolone (MEDROL DOSEPAK) 4 MG TBPK tablet Take as directed. 21 tablet 0  . omeprazole (PRILOSEC) 20 MG capsule Take 1 capsule (20 mg total) by mouth daily. (Patient taking differently: Take 20 mg by mouth daily. Using as needed) 30 capsule 1  . ondansetron (ZOFRAN) 8 MG tablet Take 1 tablet (8 mg total) by mouth 2 (two) times daily as needed for refractory nausea / vomiting. Start on day 3 after chemo. (Patient not taking: Reported on 12/22/2015) 30 tablet 1  . oxyCODONE (OXY IR/ROXICODONE) 5 MG immediate release tablet Take 1 tablet (5 mg total) by mouth every 6 (six) hours as needed for moderate pain, severe pain or breakthrough pain. 20 tablet 0  . prochlorperazine (COMPAZINE) 10 MG tablet Take 1 tablet (10 mg total) by mouth every 6 (six) hours as needed (Nausea or vomiting). 30 tablet 1  . TIROSINT 75 MCG CAPS TAKE ONE TABLET 6 DAYS A WEEK   6  . urea (CARMOL) 10 % cream Apply topically 4 (four) times daily as needed. Apply to affected areas four times daily as needed. 85 g 1  . zolpidem (AMBIEN) 5 MG tablet Take 1 tablet (5 mg total) by mouth at bedtime as needed for sleep. 30 tablet 0   No current facility-administered medications for this visit.    REVIEW OF SYSTEMS:   Constitutional: Denies fevers, chills or abnormal night sweats Eyes: Denies blurriness of vision, double vision or watery eyes Ears, nose, mouth, throat, and face: Denies mucositis or sore throat Respiratory: Denies cough, dyspnea or wheezes Cardiovascular: Denies palpitation, chest discomfort or lower extremity swelling Gastrointestinal:  Denies nausea, heartburn or change in bowel habits Skin: Denies abnormal skin rashes Lymphatics: Denies new lymphadenopathy or easy bruising Neurological:Denies numbness, tingling or new weaknesses Behavioral/Psych: Mood is  stable, no new changes  All other systems were reviewed with the patient and are negative.  PHYSICAL EXAMINATION: ECOG PERFORMANCE STATUS: 1 - Symptomatic but completely ambulatory  Filed Vitals:   01/19/16 1320  BP: 113/65  Pulse: 65  Temp: 98.4 F (36.9 C)  Resp: 18   Filed Weights   01/19/16 1320  Weight: 193 lb 1.6 oz (87.59 kg)    GENERAL:alert, no distress and comfortable SKIN: skin color, texture, turgor are normal, no rashes or significant lesions EYES: normal, conjunctiva are pink and non-injected, sclera clear OROPHARYNX:no exudate, no erythema and lips, buccal mucosa, and tongue normal  NECK: supple, thyroid normal size, non-tender, without nodularity LYMPH:  no palpable lymphadenopathy in the cervical, axillary or inguinal LUNGS: clear to auscultation and percussion with normal breathing effort HEART: regular rate & rhythm and no murmurs and no lower extremity edema ABDOMEN:abdomen soft, non-tender and normal bowel sounds Musculoskeletal:no cyanosis of digits and no clubbing   PSYCH: alert & oriented x 3 with fluent speech NEURO: no focal motor/sensory deficits Breasts: Breast inspection showed them to be symmetrical with no nipple discharge. Surgical incision in the left breast is clean.  LABORATORY DATA:  I have reviewed the data as listed CBC Latest Ref Rng 01/19/2016 12/22/2015 12/01/2015  WBC 3.9 - 10.3 10e3/uL 12.5(H) 15.6(H) 18.3(H)  Hemoglobin 11.6 - 15.9 g/dL 10.5(L) 10.6(L) 11.2(L)  Hematocrit 34.8 - 46.6 % 32.5(L) 31.8(L) 33.9(L)  Platelets 145 - 400 10e3/uL 253 294 227    CMP Latest Ref Rng 01/19/2016 12/22/2015 12/01/2015  Glucose 70 - 140 mg/dl 89 100 106  BUN 7.0 - 26.0 mg/dL 20.5 25.1 18.4  Creatinine 0.6 - 1.1 mg/dL 0.8 0.8 0.8  Sodium 136 - 145 mEq/L 143 140 143  Potassium 3.5 - 5.1 mEq/L 3.9 4.1 3.9  CO2 22 - 29 mEq/L 28 25 24   Calcium 8.4 - 10.4 mg/dL 9.2 9.8 9.5  Total Protein 6.4 - 8.3 g/dL 6.4 6.8 6.6  Total Bilirubin 0.20 - 1.20 mg/dL 0.51 0.42 0.44  Alkaline Phos 40 - 150 U/L 67 71 77  AST 5 - 34 U/L 19 18 15   ALT 0 - 55 U/L 25 26 40     PATHOLOGY REPORT   Diagnosis 09/27/2015 Breast, left, needle core biopsy, 3:00 o'clock, retroareolar - INVASIVE AND IN SITU MAMMARY CARCINOMA. - LYMPHOVASCULAR INVASION IS IDENTIFIED. - SEE COMMENT. Microscopic Comment The carcinoma appears grade 2. An E-cadherin stain will be performed and the results reported separately. A breast prognostic profile will be performed and the results reported separately. The results were called to [facility] on 09/28/2015. (JBK:ecj 09/28/2015) The malignant cells are positive for E-Cadherin, supporting a ductal phenotype.  Results: IMMUNOHISTOCHEMICAL AND MORPHOMETRIC ANALYSIS PERFORMED MANUALLY Estrogen Receptor: 95%, POSITIVE, STRONG STAINING INTENSITY Progesterone Receptor: 0%, NEGATIVE Proliferation Marker Ki67: 60% Results: HER2 - NEGATIVE RATIO OF HER2/CEP17 SIGNALS 1.04 AVERAGE HER2 COPY NUMBER PER CELL 1.20  Oncotype DX RS: 55  high risk, the group average risk of distant recurrence for patient's wishes recurrence score>50 was 34%.  Diagnosis 11/03/2015 Breast, left, needle core biopsy, upper outer quadrant - INVASIVE MAMMARY CARCINOMA. - MAMMARY CARCINOMA IN SITU. Microscopic Comment The findings are consistent with grade 2/3 invasive carcinoma and most likely represent invasive ductal as confirmed on the previous core biopsies ([id number]). E. cadherin and breast prognostic profile will be performed. Dr Lyndon Code agrees. Called to [facility] on 11/06/2015. (JDP:ecj 11/06/2015) Addendum: Immunohistochemistry with E. cadherin shows diffuse strong positivity  consistent with ductal carcinoma. (JDP:ecj 11/06/2015)  PROGNOSTIC INDICATORS Results: IMMUNOHISTOCHEMICAL AND MORPHOMETRIC ANALYSIS PERFORMED MANUALLY Estrogen Receptor: 100%, POSITIVE, STRONG STAINING INTENSITY Progesterone Receptor: 0%, NEGATIVE Proliferation Marker Ki67: 5% Results: HER2 - NEGATIVE RATIO OF HER2/CEP17 SIGNALS 1.19 AVERAGE HER2 COPY NUMBER PER CELL 1.85   Diagnosis 01/17/2016 1. Breast, lumpectomy, Left - INVASIVE DUCTAL CARCINOMA, 2.1 CM - PREVIOUS BIOPSY SITES. - INVASIVE CARCINOMA BROADLY INVOLVES THE MEDIAL MARGIN AND IS LESS THAN 0.1 CM FROM THE LATERAL MARGIN AND IS 0.1 CM FROM THE INFERIOR MARGIN. - LYMPHOVASCULAR INVOLVEMENT BY TUMOR. 2. Lymph node, sentinel, biopsy, Left axillary - ONE BENIGN LYMPH NODE (0/1). 3. Lymph node, sentinel, biopsy, Left axillary - ONE BENIGN LYMPH NODE (0/1). 4. Lymph node, sentinel, biopsy, Left axillary - ONE BENIGN LYMPH NODE (0/1). 5. Lymph node, sentinel, biopsy, Left axillary - METASTATIC CARCINOMA IN ONE LYMPH NODE (1/1). Microscopic Comment 1. BREAST, INVASIVE TUMOR, WITH LYMPH NODES PRESENT Specimen, including laterality and lymph node sampling (sentinel, non-sentinel): Left breast and four sentinel lymph nodes. Procedure: Localized lumpectomy and four sentinel lymph  node biopsies. Histologic type: Ductal. Grade: 2 Tubule formation: 3 Nuclear pleomorphism: 2 Mitotic:1 Tumor size (gross measurement): 2.1 cm Margins: Focally involved by tumor. Invasive, distance to closest margin: Broadly involves medial margin. In-situ, distance to closest margin: N/A If margin positive, focally or broadly: Broadly, medial margin. 1 of 3 FINAL for Brooke Garrison, Brooke Garrison ([id number]) Microscopic Comment(continued) Lymphovascular invasion: Present. Ductal carcinoma in situ: No Grade: N/A Extensive intraductal component: No Lobular neoplasia: No Tumor focality: See comment. Treatment effect: Present. If present, treatment effect in breast tissue, lymph nodes or both: Breast tissue. Extent of tumor: Skin: Free of tumor. Nipple: N/A Skeletal muscle: N/A Lymph nodes: Examined: 4 Sentinel 0 Non-sentinel 4 Total Lymph nodes with metastasis: 1 Isolated tumor cells (< 0.2 mm): 0 Micrometastasis: (> 0.2 mm and < 2.0 mm): 0 Macrometastasis: (> 2.0 mm): 1 Extracapsular extension: No Breast prognostic profile: Case number [id number] Estrogen receptor: Positive both lesions, 100% and 95% Progesterone receptor: Negative both lesions. Her 2 neu: Negative both lesions, ratio 1.19 and 1.04. Will be repeated on current residual tumor. Ki-67: 5% and 60% Non-neoplastic breast: Fibrocystic changes. TNM: ypT2, ypN1a, ypMX Comments: There are two separate areas of localization and one contains a residual 2.1 cm invasive ductal carcinoma. The other area of localization contains previous biopsy site with fibrosis, inflammation and hemosiderin consistent with treatment effect with no residual carcinoma identified. The residual carcinoma broadly involves the medial margin and is  RADIOGRAPHIC STUDIES: I have personally reviewed the radiological images as listed and agreed with the findings in the report.  09/28/2015  CLINICAL DATA:  2.3 cm mass in the 3 o'clock retroareolar  region of the left breast with imaging findings suspicious for malignancy. EXAM: ULTRASOUND GUIDED LEFT BREAST CORE NEEDLE BIOPSY COMPARISON:  Previous exam(s). FINDINGS: I met with the patient and we discussed the procedure of ultrasound-guided biopsy, including benefits and alternatives. We discussed the high likelihood of a successful procedure. We discussed the risks of the procedure, including infection, bleeding, tissue injury, clip migration, and inadequate sampling. Informed written consent was given. The usual time-out protocol was performed immediately prior to the procedure. Using sterile technique and 1% Lidocaine as local anesthetic, under direct ultrasound visualization, a 12 gauge spring-loaded device was used to perform biopsy of the recently demonstrated 2.3 cm mass in the 3 o'clock retroareolar region of the left breast using a caudal approach. At the conclusion of the procedure a ribbon  shaped tissue marker clip was deployed into the biopsy cavity. Follow up 2 view mammogram was performed and dictated separately. IMPRESSION: Ultrasound guided biopsy of a 2.3 cm mass in the 3 o'clock retroareolar left breast. No apparent complications. Electronically Signed: By: Claudie Revering M.D. On: 09/27/2015 11:01   Breast MRI w wo contrast 01/17/2016 FINDINGS: Status post excision of the left breast. The 2 radioactive seeds and biopsy marker clips are present, completely intact, and were marked for pathology. The positions of each seed and each biopsy marker clip within the specimen was discussed with the OR staff during the Procedure.    IMPRESSION: Specimen radiograph of the left breast.   ASSESSMENT & PLAN: 55 year old caucasian female, with past medical history of rheumatoid arthritis, on  And hypothyroidism, presented with palpable left breast mass.  1. Breast cancer of upper-outer quadrant of left female breast, multi-focal (2) cT2N0M0, stage IIA, ER+/PR-/HER2+- Ki67 60% and 5%, ypT2N1aM0,  stage IIB -I discussed her surgical pathology findings with patient and her husband in great details -She unfortunately has positive margins, she will discuss with her surgeon Dr. Donne Hazel, she'll likely need re-excision -Unfortunately she has one sentinel lymph node positive on the surgical sample, even after neoadjuvant chemotherapy. -She certainly has high risk for cancer recurrence, giving the node-positive disease after neoadjuvant chemotherapy -We discussed that the standard care is node dissection in node-positive disease after neoadjuvant chemotherapy, however she is very concerned about lymphedema after no dissection. She would prefer adjuvant radiation. She will discuss with Dr. Donne Hazel. -Due to the high-risk of recurrence, I recommend her to consider adjuvant chemotherapy. We discussed the option of 4 cycles of Adriamycin, or capecitabine for 6 months. The potential benefit and side effects were discussed with patient. She opted capecitabine, will start after she completes radiation.  -I strongly recommend her to have adjuvant anti-estrogen therapy, after radiation.   2. Rheumatology arthritis -She'll continue to follow-up with her rheumatologist -I previously spoke with her rhumatologist Dr. Billey Chang today and he agrees to hold methotrexateand (until complete RT) and Humira (at least for 5 years)  3. hypothyroidism -She will continue Synthroid and follow up with PCP   Plan -she will discuss with Dr. Donne Hazel about re-excision  -she will proceed radiation after surgery -I plan to see her back on her last few days of radiation, to finalize her adjuvant chemo with capecitabine   All questions were answered. The patient knows to call the clinic with any problems, questions or concerns. I spent 30  minutes counseling the patient face to face. The total time spent in the appointment was 35 minutes and more than 50% was on counseling.     Truitt Merle, MD 01/19/2016

## 2016-01-22 ENCOUNTER — Encounter: Payer: Self-pay | Admitting: Hematology

## 2016-01-22 ENCOUNTER — Other Ambulatory Visit: Payer: Self-pay | Admitting: General Surgery

## 2016-01-22 NOTE — Progress Notes (Signed)
form left in box-- 01/19/16

## 2016-01-22 NOTE — H&P (Addendum)
  Subjective:    Patient ID: Brooke Garrison is a 55 y.o. female.  HPI  Presents for oncoplastic reconstruction by breast reduction. Presented with palpable mass noted by PCP. MMG and US showed a 1.4 x1.7 x 2.3 cm mass left breast 3 o clock retroareolar. Axilla benign. Biopsy showed IDC  with lymphovascular invasion, ER +/ PR -, Her-2 -.  PMH significant for RA and was on MTX and Humira. These medications both held for chemotherapy and completed neoadjuvant chemotherapy.  Final MRI notes mass located at the 3 o'clock position has decreased in size now measuring 2.5 x 1.5 x 1.4 cm in size. Previously this measured 3.5 x 3.5 x 2.3 cm in size. In addition, the smaller more superiorly located tumor located at the 2 o'clock position previously measured 1.9 x 1.3 x 1.1 cm in size and on reported as vague non measurable residual enhancement.  Underwent left lumpectomy with SLN, final pathology with 2.1 cm IDC involving medial margin, +LV invasion, 1/4 SLN positive. The pathology has been reviewed with Drs. Tawni Pummel and no plan for ALND, will require reexcision of left breast. Plan adjuvant Xeloda.  Current D cup.    Review of Systems    Objective:   Physical Exam  Cardiovascular: Normal rate, regular rhythm and normal heart sounds.   Pulmonary/Chest: Effort normal and breath sounds normal.  Genitourinary: No breast discharge.  Lymphadenopathy:    She has no axillary adenopathy.  palpable mass 3 o clock left breast that involves lateral areola   L> R volume, grade II ptosis bilateral   SN to nipple R 31.5 L 33 cm BW R 17 L 17 Nipple to IMF R 13 L 14 cm Assessment:     Left breast cancer, neoadjuvant chemotherapy    Plan:     Plan oncoplastic reconstruction following reexcision left breast for margins with Dr. Donne Hazel. Reviewed reduction with anchor type scars, drains, post operative visits and limitations, recovery. Diminished sensation nipple and breast skin, risk of  nipple loss, wound healing problems, asymmetry. She will require XRT and reviewed will have some contraction of breast volume and increased firmness with radiation, less ptosis with aging. Discussed changes with wt gain, loss, aging. This tumor is subareaolar and there is definite risk of nipple loss.   Patient reports she would like to go home post op.  Irene Limbo, MD [facility] Plastic & Reconstructive Surgery [phone], pin [phone]   ADDENDUM 7.10.17 942 am Following discussion with Dr. Donne Hazel, patient would like to cancel breast reduction at this time and plan reexcision alone. Asked patient to call if she desires to reschedule prior to starting radiation.

## 2016-01-23 ENCOUNTER — Encounter (HOSPITAL_BASED_OUTPATIENT_CLINIC_OR_DEPARTMENT_OTHER): Payer: Self-pay

## 2016-01-23 ENCOUNTER — Ambulatory Visit (HOSPITAL_BASED_OUTPATIENT_CLINIC_OR_DEPARTMENT_OTHER): Payer: Managed Care, Other (non HMO) | Admitting: Anesthesiology

## 2016-01-23 ENCOUNTER — Encounter (HOSPITAL_BASED_OUTPATIENT_CLINIC_OR_DEPARTMENT_OTHER)
Admission: RE | Disposition: A | Discharge status: Home / Self Care | Payer: Self-pay | Source: Ambulatory Visit | Attending: General Surgery

## 2016-01-23 ENCOUNTER — Ambulatory Visit (HOSPITAL_BASED_OUTPATIENT_CLINIC_OR_DEPARTMENT_OTHER)
Admission: RE | Admit: 2016-01-23 | Discharge: 2016-01-23 | Disposition: A | Discharge status: Home / Self Care | Payer: Managed Care, Other (non HMO) | Source: Ambulatory Visit | Attending: General Surgery | Admitting: General Surgery

## 2016-01-23 ENCOUNTER — Encounter: Payer: Self-pay | Admitting: Hematology

## 2016-01-23 DIAGNOSIS — D0512 Intraductal carcinoma in situ of left breast: Secondary | ICD-10-CM | POA: Insufficient documentation

## 2016-01-23 DIAGNOSIS — M069 Rheumatoid arthritis, unspecified: Secondary | ICD-10-CM | POA: Diagnosis not present

## 2016-01-23 DIAGNOSIS — K219 Gastro-esophageal reflux disease without esophagitis: Secondary | ICD-10-CM | POA: Diagnosis not present

## 2016-01-23 DIAGNOSIS — Z79899 Other long term (current) drug therapy: Secondary | ICD-10-CM | POA: Insufficient documentation

## 2016-01-23 DIAGNOSIS — Z6837 Body mass index (BMI) 37.0-37.9, adult: Secondary | ICD-10-CM | POA: Insufficient documentation

## 2016-01-23 DIAGNOSIS — Z170 Estrogen receptor positive status [ER+]: Secondary | ICD-10-CM | POA: Insufficient documentation

## 2016-01-23 DIAGNOSIS — C50412 Malignant neoplasm of upper-outer quadrant of left female breast: Secondary | ICD-10-CM | POA: Insufficient documentation

## 2016-01-23 DIAGNOSIS — E669 Obesity, unspecified: Secondary | ICD-10-CM | POA: Diagnosis not present

## 2016-01-23 DIAGNOSIS — E039 Hypothyroidism, unspecified: Secondary | ICD-10-CM | POA: Insufficient documentation

## 2016-01-23 DIAGNOSIS — Z9221 Personal history of antineoplastic chemotherapy: Secondary | ICD-10-CM | POA: Insufficient documentation

## 2016-01-23 DIAGNOSIS — C50912 Malignant neoplasm of unspecified site of left female breast: Secondary | ICD-10-CM | POA: Diagnosis present

## 2016-01-23 HISTORY — DX: Gastro-esophageal reflux disease without esophagitis: K21.9

## 2016-01-23 HISTORY — DX: Hypothyroidism, unspecified: E03.9

## 2016-01-23 HISTORY — PX: RE-EXCISION OF BREAST LUMPECTOMY: SHX6048

## 2016-01-23 SURGERY — EXCISION, LESION, BREAST
Anesthesia: General | Site: Breast | Laterality: Left

## 2016-01-23 MED ORDER — BUPIVACAINE HCL (PF) 0.25 % IJ SOLN
INTRAMUSCULAR | Status: DC | PRN
  Administered 2016-01-23: 10 mL

## 2016-01-23 MED ORDER — OXYCODONE HCL 5 MG PO TABS: 5.0000 mg | ORAL_TABLET | Freq: Four times a day (QID) | ORAL | Status: DC | PRN

## 2016-01-23 MED ORDER — CHLORHEXIDINE GLUCONATE CLOTH 2 % EX PADS
6.0000 | MEDICATED_PAD | Freq: Once | CUTANEOUS | Status: DC
Start: 2016-01-23 — End: 2016-01-23

## 2016-01-23 MED ORDER — LIDOCAINE 2% (20 MG/ML) 5 ML SYRINGE
INTRAMUSCULAR | Status: AC
  Filled 2016-01-23: qty 5

## 2016-01-23 MED ORDER — ACETAMINOPHEN 500 MG PO TABS
ORAL_TABLET | ORAL | Status: AC
  Filled 2016-01-23: qty 2

## 2016-01-23 MED ORDER — FENTANYL CITRATE (PF) 100 MCG/2ML IJ SOLN: 25.0000 ug | INTRAMUSCULAR | Status: DC | PRN

## 2016-01-23 MED ORDER — PROPOFOL 10 MG/ML IV BOLUS
INTRAVENOUS | Status: AC
  Filled 2016-01-23: qty 20

## 2016-01-23 MED ORDER — SCOPOLAMINE 1 MG/3DAYS TD PT72: 1.0000 | MEDICATED_PATCH | Freq: Once | TRANSDERMAL | Status: DC | PRN

## 2016-01-23 MED ORDER — MIDAZOLAM HCL 2 MG/2ML IJ SOLN
INTRAMUSCULAR | Status: AC
  Filled 2016-01-23: qty 2

## 2016-01-23 MED ORDER — CEFAZOLIN SODIUM-DEXTROSE 2-4 GM/100ML-% IV SOLN
INTRAVENOUS | Status: AC
  Filled 2016-01-23: qty 100

## 2016-01-23 MED ORDER — GABAPENTIN 300 MG PO CAPS: 300.0000 mg | ORAL_CAPSULE | ORAL | Status: DC

## 2016-01-23 MED ORDER — ONDANSETRON HCL 4 MG/2ML IJ SOLN: 4.0000 mg | Freq: Once | INTRAMUSCULAR | Status: DC | PRN

## 2016-01-23 MED ORDER — ONDANSETRON HCL 4 MG/2ML IJ SOLN
INTRAMUSCULAR | Status: DC | PRN
  Administered 2016-01-23: 4 mg via INTRAVENOUS

## 2016-01-23 MED ORDER — FENTANYL CITRATE (PF) 100 MCG/2ML IJ SOLN
50.0000 ug | INTRAMUSCULAR | Status: DC | PRN
  Administered 2016-01-23: 50 ug via INTRAVENOUS

## 2016-01-23 MED ORDER — LACTATED RINGERS IV SOLN
INTRAVENOUS | Status: DC
  Administered 2016-01-23: 07:00:00 via INTRAVENOUS

## 2016-01-23 MED ORDER — MIDAZOLAM HCL 2 MG/2ML IJ SOLN
1.0000 mg | INTRAMUSCULAR | Status: DC | PRN
  Administered 2016-01-23: 2 mg via INTRAVENOUS

## 2016-01-23 MED ORDER — DEXAMETHASONE SODIUM PHOSPHATE 4 MG/ML IJ SOLN
INTRAMUSCULAR | Status: DC | PRN
  Administered 2016-01-23: 10 mg via INTRAVENOUS

## 2016-01-23 MED ORDER — ACETAMINOPHEN 500 MG PO TABS
1000.0000 mg | ORAL_TABLET | ORAL | Status: AC
  Administered 2016-01-23: 1000 mg via ORAL

## 2016-01-23 MED ORDER — CEFAZOLIN SODIUM-DEXTROSE 2-4 GM/100ML-% IV SOLN: 2.0000 g | INTRAVENOUS | Status: DC

## 2016-01-23 MED ORDER — GABAPENTIN 300 MG PO CAPS
ORAL_CAPSULE | ORAL | Status: AC
  Filled 2016-01-23: qty 1

## 2016-01-23 MED ORDER — GLYCOPYRROLATE 0.2 MG/ML IJ SOLN: 0.2000 mg | Freq: Once | INTRAMUSCULAR | Status: DC | PRN

## 2016-01-23 MED ORDER — ACETAMINOPHEN 500 MG PO TABS: 1000.0000 mg | ORAL_TABLET | ORAL | Status: DC

## 2016-01-23 MED ORDER — FENTANYL CITRATE (PF) 100 MCG/2ML IJ SOLN
INTRAMUSCULAR | Status: AC
  Filled 2016-01-23: qty 2

## 2016-01-23 MED ORDER — BUPIVACAINE HCL (PF) 0.25 % IJ SOLN
INTRAMUSCULAR | Status: AC
  Filled 2016-01-23: qty 30

## 2016-01-23 MED ORDER — GABAPENTIN 300 MG PO CAPS
300.0000 mg | ORAL_CAPSULE | ORAL | Status: AC
  Administered 2016-01-23: 300 mg via ORAL

## 2016-01-23 MED ORDER — LIDOCAINE 2% (20 MG/ML) 5 ML SYRINGE
INTRAMUSCULAR | Status: DC | PRN
  Administered 2016-01-23: 100 mg via INTRAVENOUS

## 2016-01-23 MED ORDER — DEXAMETHASONE SODIUM PHOSPHATE 10 MG/ML IJ SOLN
INTRAMUSCULAR | Status: AC
  Filled 2016-01-23: qty 1

## 2016-01-23 MED ORDER — ONDANSETRON HCL 4 MG/2ML IJ SOLN
INTRAMUSCULAR | Status: AC
  Filled 2016-01-23: qty 2

## 2016-01-23 MED ORDER — CEFAZOLIN SODIUM-DEXTROSE 2-4 GM/100ML-% IV SOLN
2.0000 g | INTRAVENOUS | Status: AC
  Administered 2016-01-23: 2 g via INTRAVENOUS

## 2016-01-23 MED ORDER — CHLORHEXIDINE GLUCONATE CLOTH 2 % EX PADS: 6.0000 | MEDICATED_PAD | Freq: Once | CUTANEOUS | Status: DC

## 2016-01-23 SURGICAL SUPPLY — 56 items
BINDER BREAST LRG (GAUZE/BANDAGES/DRESSINGS) IMPLANT
BINDER BREAST MEDIUM (GAUZE/BANDAGES/DRESSINGS) IMPLANT
BINDER BREAST XLRG (GAUZE/BANDAGES/DRESSINGS) ×4 IMPLANT
BINDER BREAST XXLRG (GAUZE/BANDAGES/DRESSINGS) IMPLANT
BLADE SURG 15 STRL LF DISP TIS (BLADE) ×2 IMPLANT
BLADE SURG 15 STRL SS (BLADE) ×2
CANISTER SUCT 1200ML W/VALVE (MISCELLANEOUS) IMPLANT
CHLORAPREP W/TINT 26ML (MISCELLANEOUS) ×4 IMPLANT
CLIP TI WIDE RED SMALL 6 (CLIP) IMPLANT
CLOSURE WOUND 1/2 X4 (GAUZE/BANDAGES/DRESSINGS) ×1
COVER BACK TABLE 60X90IN (DRAPES) ×4 IMPLANT
COVER MAYO STAND STRL (DRAPES) ×4 IMPLANT
DECANTER SPIKE VIAL GLASS SM (MISCELLANEOUS) IMPLANT
DEVICE DUBIN W/COMP PLATE 8390 (MISCELLANEOUS) IMPLANT
DRAPE LAPAROTOMY 100X72 PEDS (DRAPES) ×4 IMPLANT
DRSG TEGADERM 4X4.75 (GAUZE/BANDAGES/DRESSINGS) ×4 IMPLANT
ELECT BLADE 4.0 EZ CLEAN MEGAD (MISCELLANEOUS)
ELECT COATED BLADE 2.86 ST (ELECTRODE) ×4 IMPLANT
ELECT REM PT RETURN 9FT ADLT (ELECTROSURGICAL) ×4
ELECTRODE BLDE 4.0 EZ CLN MEGD (MISCELLANEOUS) IMPLANT
ELECTRODE REM PT RTRN 9FT ADLT (ELECTROSURGICAL) ×2 IMPLANT
GLOVE BIO SURGEON STRL SZ 6.5 (GLOVE) ×3 IMPLANT
GLOVE BIO SURGEON STRL SZ7 (GLOVE) ×8 IMPLANT
GLOVE BIO SURGEONS STRL SZ 6.5 (GLOVE) ×1
GLOVE BIOGEL PI IND STRL 7.5 (GLOVE) ×2 IMPLANT
GLOVE BIOGEL PI INDICATOR 7.5 (GLOVE) ×2
GLOVE SURG SS PI 6.5 STRL IVOR (GLOVE) ×4 IMPLANT
GOWN STRL REUS W/ TWL LRG LVL3 (GOWN DISPOSABLE) ×6 IMPLANT
GOWN STRL REUS W/TWL LRG LVL3 (GOWN DISPOSABLE) ×6
ILLUMINATOR WAVEGUIDE N/F (MISCELLANEOUS) IMPLANT
KIT MARKER MARGIN INK (KITS) ×4 IMPLANT
LIGHT WAVEGUIDE WIDE FLAT (MISCELLANEOUS) IMPLANT
LIQUID BAND (GAUZE/BANDAGES/DRESSINGS) IMPLANT
NEEDLE HYPO 25X1 1.5 SAFETY (NEEDLE) ×4 IMPLANT
NS IRRIG 1000ML POUR BTL (IV SOLUTION) IMPLANT
PACK BASIN DAY SURGERY FS (CUSTOM PROCEDURE TRAY) ×4 IMPLANT
PENCIL BUTTON HOLSTER BLD 10FT (ELECTRODE) ×4 IMPLANT
SLEEVE SCD COMPRESS KNEE MED (MISCELLANEOUS) ×4 IMPLANT
SPONGE GAUZE 4X4 12PLY STER LF (GAUZE/BANDAGES/DRESSINGS) ×4 IMPLANT
SPONGE LAP 4X18 X RAY DECT (DISPOSABLE) ×4 IMPLANT
STRIP CLOSURE SKIN 1/2X4 (GAUZE/BANDAGES/DRESSINGS) ×3 IMPLANT
SUT MNCRL AB 4-0 PS2 18 (SUTURE) ×4 IMPLANT
SUT MON AB 5-0 PS2 18 (SUTURE) IMPLANT
SUT SILK 2 0 SH (SUTURE) ×4 IMPLANT
SUT VIC AB 2-0 SH 27 (SUTURE) ×2
SUT VIC AB 2-0 SH 27XBRD (SUTURE) ×2 IMPLANT
SUT VIC AB 3-0 SH 27 (SUTURE) ×2
SUT VIC AB 3-0 SH 27X BRD (SUTURE) ×2 IMPLANT
SUT VIC AB 5-0 PS2 18 (SUTURE) IMPLANT
SUT VICRYL AB 3 0 TIES (SUTURE) IMPLANT
SYR CONTROL 10ML LL (SYRINGE) ×4 IMPLANT
TOWEL OR 17X24 6PK STRL BLUE (TOWEL DISPOSABLE) ×4 IMPLANT
TOWEL OR NON WOVEN STRL DISP B (DISPOSABLE) ×4 IMPLANT
TUBE CONNECTING 20'X1/4 (TUBING)
TUBE CONNECTING 20X1/4 (TUBING) IMPLANT
YANKAUER SUCT BULB TIP NO VENT (SUCTIONS) ×4 IMPLANT

## 2016-01-23 NOTE — Progress Notes (Signed)
form left in box- left for dr. Burr Medico to sign- faxed form to guardian and left mess to let patient know I faxed and mailed copy and sent to medical recrds

## 2016-01-23 NOTE — Progress Notes (Signed)
form left in box- left for dr. Burr Medico to sign

## 2016-01-23 NOTE — Op Note (Signed)
Preoperative diagnoses:left breast cancer times two (clinical stage II at presentation) s/p primary chemotherapy positive medial margin on tumor at dumbbell clip Postoperative diagnosis: Same as above Procedure:re-excision left breast lumpectomy, medial margin Surgeon: Dr. Serita Grammes Anesthesia: Gen Estimated blood loss: Minimal Complications: None Drains: None Specimens:left breast tissue medial margin marked with paint Sponge and needle count correct at completion Disposition to recovery stable  Indications: This is a 80 yof who underwent primary chemotherapy followed by lumpectomy. One tumor resolved, the other has a positive medial margin.  We have discussed reexcision. She would like to hold on reduction now.   Procedure:After informed consent was obtained she was then taken to the operating room. She was given cefazolin. Sequential compression devices were on her legs. She was placed under general anesthesia without complication. Her left breast was then prepped and draped in the standard sterile surgical fashion. A surgical timeout was then performed. I reentered her old incision and opened the cavity. I then excised the medial margin where it was positive. I marked this with paint.  I then closed with 2-0 vicryl, 3-0 vicryl and 4-0 monocryl. Glue and steristrips were applied. A binder was applied. She was extubated and transferred to recovery stable.

## 2016-01-23 NOTE — Anesthesia Preprocedure Evaluation (Addendum)
Anesthesia Evaluation  Patient identified by MRN, date of birth, ID band Patient awake    Reviewed: Allergy & Precautions, NPO status , Patient's Chart, lab work & pertinent test results  History of Anesthesia Complications Negative for: history of anesthetic complications  Airway Mallampati: II  TM Distance: >3 FB Neck ROM: Full    Dental  (+) Teeth Intact, Dental Advisory Given   Pulmonary neg pulmonary ROS,    Pulmonary exam normal breath sounds clear to auscultation       Cardiovascular Exercise Tolerance: Good negative cardio ROS Normal cardiovascular exam Rhythm:Regular Rate:Normal     Neuro/Psych negative neurological ROS  negative psych ROS   GI/Hepatic Neg liver ROS, GERD  Medicated,  Endo/Other  Hypothyroidism Obesity   Renal/GU negative Renal ROS     Musculoskeletal  (+) Arthritis , Rheumatoid disorders,    Abdominal   Peds  Hematology negative hematology ROS (+)   Anesthesia Other Findings Day of surgery medications reviewed with the patient.  Breast cancer   Reproductive/Obstetrics                            Anesthesia Physical Anesthesia Plan  ASA: II  Anesthesia Plan: General   Post-op Pain Management:    Induction: Intravenous  Airway Management Planned: LMA  Additional Equipment:   Intra-op Plan:   Post-operative Plan: Extubation in OR  Informed Consent: I have reviewed the patients History and Physical, chart, labs and discussed the procedure including the risks, benefits and alternatives for the proposed anesthesia with the patient or authorized representative who has indicated his/her understanding and acceptance.   Dental advisory given  Plan Discussed with: CRNA  Anesthesia Plan Comments: (Risks/benefits of general anesthesia discussed with patient including risk of damage to teeth, lips, gum, and tongue, nausea/vomiting, allergic reactions to  medications, and the possibility of heart attack, stroke and death.  All patient questions answered.  Patient wishes to proceed.  LMA if GERD controlled with meds.)      Anesthesia Quick Evaluation

## 2016-01-23 NOTE — Anesthesia Postprocedure Evaluation (Signed)
Anesthesia Post Note  Patient: Brooke Garrison  Procedure(s) Performed: Procedure(s) (LRB): RE-EXCISION LEFT BREAST LUMPECTOMY (Left)  Patient location during evaluation: PACU Anesthesia Type: General Level of consciousness: awake and alert Pain management: pain level controlled Vital Signs Assessment: post-procedure vital signs reviewed and stable Respiratory status: spontaneous breathing, nonlabored ventilation, respiratory function stable and patient connected to nasal cannula oxygen Cardiovascular status: blood pressure returned to baseline and stable Postop Assessment: no signs of nausea or vomiting Anesthetic complications: no    Last Vitals:  Filed Vitals:   01/23/16 0845 01/23/16 0923  BP: 99/58 99/67  Pulse: 59 61  Temp:  36.2 C  Resp: 12 18    Last Pain:  Filed Vitals:   01/23/16 0925  PainSc: 0-No pain                 Catalina Gravel

## 2016-01-23 NOTE — H&P (View-Only) (Signed)
Brooke Garrison is an 55 y.o. female.   Chief Complaint: left breast cancer HPI:  38 yof who had a palpable left breast mass noted in march 2017. she underwent evaluation at that time that showed a left breast mass at 3 oclock with biopsy showing idc and dcis, lvi, er pos, pr negative, her2 negative, Ki is 60%. this was 2.3 cm mass at 3 oclock in the retroareolar right breast. she had oncotype on core that was 26. she had initial mri initially showed an additional area that was biopsied and shows an idc that is er pos, pr neg and her2 negative. she has undergone chemo and she completed it last friday. she has tolerated chemo fairly well. her latest mri shows nl right breast. the left breast mass that previously measured 3.5x3.5x2.3 cm in size that is now 2.5x1.5x1.4 cm. the second more superiorly located tumor that is 4.7 cm away on mlo views is gone on the mri.     Past Medical History  Diagnosis Date  . Thyroid disease   . Hypothyroidism   . GERD (gastroesophageal reflux disease)     thruout chemo  . Arthritis     RA-stopped methotrexate and humira  . Breast cancer of upper-outer quadrant of left female breast ([place]) 09/29/2015  . Breast cancer [facility])     Past Surgical History  Procedure Laterality Date  . Abdominal hysterectomy      partial    Family History  Problem Relation Age of Onset  . Hodgkin's lymphoma Mother   . Melanoma Maternal Grandfather    Social History:  reports that she has never smoked. She does not have any smokeless tobacco history on file. She reports that she drinks alcohol. She reports that she does not use illicit drugs.  Allergies:  Allergies  Allergen Reactions  . Sulfa Antibiotics Hives    Itching, rash.    Medications Prior to Admission  Medication Sig Dispense Refill  . acyclovir (ZOVIRAX) 400 MG tablet Take 800 mg by mouth daily.  10  . cetirizine (ZYRTEC) 10 MG tablet Take 10 mg by mouth daily.    . folic acid (FOLVITE) 1 MG tablet Take  1 mg by mouth 2 (two) times daily. Dosage varies  5  . liothyronine (CYTOMEL) 5 MCG tablet Take 5 mcg by mouth daily.  5  . LORazepam (ATIVAN) 0.5 MG tablet Take 1 tablet (0.5 mg total) by mouth every 6 (six) hours as needed for anxiety or sleep (anxiety). 20 tablet 0  . omeprazole (PRILOSEC) 20 MG capsule Take 1 capsule (20 mg total) by mouth daily. (Patient taking differently: Take 20 mg by mouth daily. Using as needed) 30 capsule 1  . TIROSINT 75 MCG CAPS TAKE ONE TABLET 6 DAYS A WEEK  6  . zolpidem (AMBIEN) 5 MG tablet Take 1 tablet (5 mg total) by mouth at bedtime as needed for sleep. 30 tablet 0  . dexamethasone (DECADRON) 4 MG tablet Take 2 tablets (8 mg total) by mouth 2 (two) times daily. Start the day before Taxotere. Then again the day after chemo for 3 days. 30 tablet 1  . methylPREDNISolone (MEDROL DOSEPAK) 4 MG TBPK tablet Take as directed. 21 tablet 0  . ondansetron (ZOFRAN) 8 MG tablet Take 1 tablet (8 mg total) by mouth 2 (two) times daily as needed for refractory nausea / vomiting. Start on day 3 after chemo. (Patient not taking: Reported on 12/22/2015) 30 tablet 1  . prochlorperazine (COMPAZINE) 10 MG tablet Take  1 tablet (10 mg total) by mouth every 6 (six) hours as needed (Nausea or vomiting). 30 tablet 1  . urea (CARMOL) 10 % cream Apply topically 4 (four) times daily as needed. Apply to affected areas four times daily as needed. 85 g 1    No results found for this or any previous visit (from the past 48 hour(s)). No results found.  ROS Negative  Blood pressure 106/71, pulse 72, temperature 98.3 F (36.8 C), temperature source Oral, resp. rate 20, height 5' (1.524 m), weight 87.998 kg (194 lb), last menstrual period 01/08/2006, SpO2 99 %. Physical Exam  Vitals (Sonya Bynum CMA; 12/29/2015 10:58 AM) 12/29/2015 10:57 AM Weight: 191 lb Height: 60in Body Surface Area: 1.83 m Body Mass Index: 37.3 kg/m  Temp.: 30F(Temporal)  Pulse: 73 (Regular)  BP: 136/78  (Sitting, Left Arm, Standard) Physical Exam Rolm Bookbinder MD; 12/29/2015 7:15 PM) General Mental Status-Alert. Orientation-Oriented X3. Chest and Lung Exam Chest and lung exam reveals -on auscultation, normal breath sounds, no adventitious sounds and normal vocal resonance. Breast Nipples-No Discharge. Small residual mass ruoq Cardiovascular Cardiovascular examination reveals -normal heart sounds, regular rate and rhythm with no murmurs.     Assessment & Plan Rolm Bookbinder MD; 12/29/2015 7:25 PM) BREAST CANCER OF UPPER-OUTER QUADRANT OF LEFT FEMALE BREAST (C50.412) Story: Left breast seed guided double lumpectomy with possible left breast reduction lumpectomy, left axillary sn biopsy we discussed good result on mri. we discussed options for treatment of the breast tumor including double breast lumpectomy combined with reduction which she would desire. we also discussed mastectomy and possible reconstruction. I think reasonable to do double lumpectomy and I think her local recurrence is the same if I get negative margins. I will have her see plastic surgery and then schedule asap. we discussed sentinel node biopsy at the same time as well.    Rolm Bookbinder, MD 01/17/2016, 1:00 PM

## 2016-01-23 NOTE — Interval H&P Note (Signed)
History and Physical Interval Note:  01/23/2016 7:20 AM Medial margin positive, we discussed re-excision, will not do reduction now Brooke Garrison  has presented today for surgery, with the diagnosis of left breast cancer  The various methods of treatment have been discussed with the patient and family. After consideration of risks, benefits and other options for treatment, the patient has consented to  Procedure(s): RE-EXCISION LEFT BREAST LUMPECTOMY (Left) LEFT BREAST REEXCISION LUMPECTOMY (Left) as a surgical intervention .  The patient's history has been reviewed, patient examined, no change in status, stable for surgery.  I have reviewed the patient's chart and labs.  Questions were answered to the patient's satisfaction.     [name]

## 2016-01-23 NOTE — Transfer of Care (Signed)
Immediate Anesthesia Transfer of Care Note  Patient: Brooke Garrison  Procedure(s) Performed: Procedure(s): RE-EXCISION LEFT BREAST LUMPECTOMY (Left)  Patient Location: PACU  Anesthesia Type:General  Level of Consciousness: awake, sedated and patient cooperative  Airway & Oxygen Therapy: Patient Spontanous Breathing and Patient connected to face mask oxygen  Post-op Assessment: Report given to RN and Post -op Vital signs reviewed and stable  Post vital signs: Reviewed and stable  Last Vitals:  Filed Vitals:   01/23/16 0811 01/23/16 0812  BP:    Pulse: 80 77  Temp:    Resp:      Last Pain:  Filed Vitals:   01/23/16 0812  PainSc: 2       Patients Stated Pain Goal: 2 ([id number])  Complications: No apparent anesthesia complications

## 2016-01-23 NOTE — Discharge Instructions (Signed)
[facility] °Office Phone Number [phone] ° °POST OP INSTRUCTIONS ° °Always review your discharge instruction sheet given to you by the facility where your surgery was performed. ° °IF YOU HAVE DISABILITY OR FAMILY LEAVE FORMS, YOU MUST BRING THEM TO THE OFFICE FOR PROCESSING.  DO NOT GIVE THEM TO YOUR DOCTOR. ° °1. A prescription for pain medication may be given to you upon discharge.  Take your pain medication as prescribed, if needed.  If narcotic pain medicine is not needed, then you may take acetaminophen (Tylenol), naprosyn (Alleve) or ibuprofen (Advil) as needed. °2. Take your usually prescribed medications unless otherwise directed °3. If you need a refill on your pain medication, please contact your pharmacy.  They will contact our office to request authorization.  Prescriptions will not be filled after 5pm or on week-ends. °4. You should eat very light the first 24 hours after surgery, such as soup, crackers, pudding, etc.  Resume your normal diet the day after surgery. °5. Most patients will experience some swelling and bruising in the breast.  Ice packs and a good support bra will help.  Wear the breast binder provided or a sports bra for 72 hours day and night.  After that wear a sports bra during the day until you return to the office. Swelling and bruising can take several days to resolve.  °6. It is common to experience some constipation if taking pain medication after surgery.  Increasing fluid intake and taking a stool softener will usually help or prevent this problem from occurring.  A mild laxative (Milk of Magnesia or Miralax) should be taken according to package directions if there are no bowel movements after 48 hours. °7. Unless discharge instructions indicate otherwise, you may remove your bandages 48 hours after surgery and you may shower at that time.  You may have steri-strips (small skin tapes) in place directly over the incision.  These strips should be left on the  skin for 7-10 days and will come off on their own.  If your surgeon used skin glue on the incision, you may shower in 24 hours.  The glue will flake off over the next 2-3 weeks.  Any sutures or staples will be removed at the office during your follow-up visit. °8. ACTIVITIES:  You may resume regular daily activities (gradually increasing) beginning the next day.  Wearing a good support bra or sports bra minimizes pain and swelling.  You may have sexual intercourse when it is comfortable. °a. You may drive when you no longer are taking prescription pain medication, you can comfortably wear a seatbelt, and you can safely maneuver your car and apply brakes. °b. RETURN TO WORK:  ______________________________________________________________________________________ °9. You should see your doctor in the office for a follow-up appointment approximately two weeks after your surgery.  Your doctor’s nurse will typically make your follow-up appointment when she calls you with your pathology report.  Expect your pathology report 3-4 business days after your surgery.  You may call to check if you do not hear from us after three days. °10. OTHER INSTRUCTIONS: _______________________________________________________________________________________________ _____________________________________________________________________________________________________________________________________ °_____________________________________________________________________________________________________________________________________ °_____________________________________________________________________________________________________________________________________ ° °WHEN TO CALL DR WAKEFIELD: °1. Fever over 101.0 °2. Nausea and/or vomiting. °3. Extreme swelling or bruising. °4. Continued bleeding from incision. °5. Increased pain, redness, or drainage from the incision. ° °The clinic staff is available to answer your questions during regular  business hours.  Please don’t hesitate to call and ask to speak to one of the nurses for clinical concerns.  If   you have a medical emergency, go to the nearest emergency room or call 911.  A surgeon from [facility] is always on call at the hospital. ° °For further questions, please visit [email or web address] ° ° ° °Post Anesthesia Home Care Instructions ° °Activity: °Get plenty of rest for the remainder of the day. A responsible adult should stay with you for 24 hours following the procedure.  °For the next 24 hours, DO NOT: °-Drive a car °-Operate machinery °-Drink alcoholic beverages °-Take any medication unless instructed by your physician °-Make any legal decisions or sign important papers. ° °Meals: °Start with liquid foods such as gelatin or soup. Progress to regular foods as tolerated. Avoid greasy, spicy, heavy foods. If nausea and/or vomiting occur, drink only clear liquids until the nausea and/or vomiting subsides. Call your physician if vomiting continues. ° °Special Instructions/Symptoms: °Your throat may feel dry or sore from the anesthesia or the breathing tube placed in your throat during surgery. If this causes discomfort, gargle with warm salt water. The discomfort should disappear within 24 hours. ° °If you had a scopolamine patch placed behind your ear for the management of post- operative nausea and/or vomiting: ° °1. The medication in the patch is effective for 72 hours, after which it should be removed.  Wrap patch in a tissue and discard in the trash. Wash hands thoroughly with soap and water. °2. You may remove the patch earlier than 72 hours if you experience unpleasant side effects which may include dry mouth, dizziness or visual disturbances. °3. Avoid touching the patch. Wash your hands with soap and water after contact with the patch. °  ° °

## 2016-01-23 NOTE — Anesthesia Procedure Notes (Signed)
Procedure Name: LMA Insertion Date/Time: 01/23/2016 7:35 AM Performed by: Lyndee Leo Pre-anesthesia Checklist: Patient identified, Emergency Drugs available, Suction available and Patient being monitored Patient Re-evaluated:Patient Re-evaluated prior to inductionOxygen Delivery Method: Circle system utilized Preoxygenation: Pre-oxygenation with 100% oxygen Intubation Type: IV induction Ventilation: Mask ventilation without difficulty LMA: LMA inserted LMA Size: 4.0 Number of attempts: 1 Airway Equipment and Method: Bite block Placement Confirmation: positive ETCO2 Tube secured with: Tape Dental Injury: Teeth and Oropharynx as per pre-operative assessment

## 2016-01-24 ENCOUNTER — Encounter (HOSPITAL_BASED_OUTPATIENT_CLINIC_OR_DEPARTMENT_OTHER): Payer: Self-pay | Admitting: General Surgery

## 2016-01-25 ENCOUNTER — Encounter (HOSPITAL_BASED_OUTPATIENT_CLINIC_OR_DEPARTMENT_OTHER): Payer: Self-pay | Admitting: General Surgery

## 2016-01-26 ENCOUNTER — Encounter (HOSPITAL_BASED_OUTPATIENT_CLINIC_OR_DEPARTMENT_OTHER): Payer: Self-pay | Admitting: General Surgery

## 2016-01-31 ENCOUNTER — Other Ambulatory Visit: Payer: Self-pay | Admitting: Hematology

## 2016-02-02 ENCOUNTER — Telehealth: Payer: Self-pay | Admitting: *Deleted

## 2016-02-02 ENCOUNTER — Other Ambulatory Visit: Payer: Self-pay | Admitting: *Deleted

## 2016-02-02 DIAGNOSIS — C50412 Malignant neoplasm of upper-outer quadrant of left female breast: Secondary | ICD-10-CM

## 2016-02-02 MED ORDER — OMEPRAZOLE 20 MG PO CPDR: 20.0000 mg | DELAYED_RELEASE_CAPSULE | Freq: Every day | ORAL | Status: AC

## 2016-02-02 NOTE — Telephone Encounter (Signed)
Called pt and left message on voice mail re:  Per Dr. Burr Medico,  [place] to restart Methotrexate for her RA  Until  Radiation  Starts.   Pt needs to check with radiation oncologist about taking Methotrexate.

## 2016-02-02 NOTE — Telephone Encounter (Signed)
Pt called and left message requesting a call back from nurse.  Spoke with pt and was informed that pt had stopped Methotrexate as instructed when pt received chemo.  Pt had completed chemotherapy for about 3 -4 weeks now;  Will have consultation with radiation provider on 7/31.   This would be a long time for pt to stop her RA medicatiions.  Pt stated her RA symptoms have been worsened - more stiffening problems now.   Pt would like to know if Dr. Burr Medico would OK for pt to restart her methotrexate now to help with her RA. Pt was on  Methotrexate  2.5 mg -  Take  6 tabs per week. Pt's    Phone    [phone].

## 2016-02-07 NOTE — Progress Notes (Signed)
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 - DUCTAL CARCINOMA IN SITU IS LESS THAN 0.1 CM FROM THE POSTERIOR/SUPERIOR MARGIN. - DUCTAL CARCINOMA IN SITU IS 0.1 TO 0.2 CM TO THE LATERAL MARGIN. - OTHER MARGINS ARE NEGATIVE. - NO INVASIVE CARCINOMA IDENTIFIED. - SEE COMMENT.  01/17/16 Diagnosis 1. Breast, lumpectomy, Left - INVASIVE DUCTAL CARCINOMA, 2.1 CM - PREVIOUS BIOPSY SITES. - INVASIVE CARCINOMA BROADLY INVOLVES THE MEDIAL MARGIN AND IS LESS THAN 0.1 CM FROM THE LATERAL MARGIN AND IS 0.1 CM FROM THE INFERIOR MARGIN. - LYMPHOVASCULAR INVOLVEMENT BY TUMOR. 2. Lymph node, sentinel, biopsy, Left axillary - ONE BENIGN LYMPH NODE (0/1). 3. Lymph node, sentinel, biopsy, Left axillary - ONE BENIGN LYMPH NODE (0/1). 4. Lymph node, sentinel, biopsy, Left axillary - ONE BENIGN LYMPH NODE (0/1). 5. Lymph node, sentinel, biopsy, Left axillary - METASTATIC CARCINOMA IN ONE LYMPH NODE (1/1).  Diagnosis 11/03/2015: Breast, left, needle core biopsy, upper outer quadrant - INVASIVE MAMMARY CARCINOMA.- MAMMARY CARCINOMA IN SITU.   Diagnosis 09/27/15: Breast, left, needle core biopsy, 3:00 o'clock, retroareolar- INVASIVE AND IN SITU MAMMARY CARCINOMA.- LYMPHOVASCULAR INVASION IS IDENTIFIED  Receptor Status: ER(95%+), PR ( 0%neg), Her2-neu ( neg ratio=1.04 ), Ki-(60%)  Did patient present with symptoms (if so, please note symptoms) or was this found on screening mammography?: GYN found palpable mass on routine GYN Visit  Past/Anticipated interventions by surgeon, if any: 01/23/16 - Procedure: RE-EXCISION LEFT BREAST LUMPECTOMY;  Surgeon: Rolm Bookbinder, MD, 01/17/16 - Procedure: DOUBLE RADIOACTIVE SEED GUIDED LEFT BREAST LUMPECTOMY AND LEFT AXILLARY SENTINEL  LYMPH NODE BIOPSY ;  Surgeon: Rolm Bookbinder, MD  Past/Anticipated interventions by medical oncology, if any: Chemotherapy  Oncotype=55 , Chemotherapy TC every 3 weeks started on 10/20/15 - finished 12/22/15.    Lymphedema issues, if any: no lymphedema but has swelling in hand and lower legs  Pain issues, if any:  Has RA and has had severe pain in her hands, left knee and feet that started 01/30/16.  She is taking naproxen 500 mg daily, tramadol 50 mg once daily and has started methotrexate a week and half ago.  OB GYN history:  Menarche age 52,G2P2,contraceptive 42 years,2009 hysterectomy  SAFETY ISSUES:  Prior radiation? no  Pacemaker/ICD? no  Possible current pregnancy? no  Is the patient on methotrexate? Yes, has RA and on Humara for 4-5 years - started methotrexate on 02/02/16  Current Complaints / other details:  Patient is here with her husband.    BP 116/74 (BP Location: Right Arm, Patient Position: Sitting)   Pulse 70   Temp 98 F (36.7 C) (Oral)   Ht 5' (1.524 m)   Wt 194 lb 1.6 oz (88 kg)   LMP 01/08/2006   SpO2 100%   BMI 37.91 kg/m    Wt Readings from Last 3 Encounters:  02/12/16 194 lb 1.6 oz (88 kg)  01/23/16 193 lb (87.5 kg)  01/19/16 193 lb 1.6 oz (87.6 kg)

## 2016-02-12 ENCOUNTER — Ambulatory Visit
Admission: RE | Admit: 2016-02-12 | Discharge: 2016-02-12 | Disposition: A | Discharge status: Home / Self Care | Payer: Managed Care, Other (non HMO) | Source: Ambulatory Visit | Attending: Radiation Oncology | Admitting: Radiation Oncology

## 2016-02-12 ENCOUNTER — Ambulatory Visit
Admission: RE | Admit: 2016-02-12 | Discharge: 2016-02-12 | Disposition: A | Discharge status: Home / Self Care | Payer: Managed Care, Other (non HMO) | Source: Ambulatory Visit | Admitting: Radiation Oncology

## 2016-02-12 ENCOUNTER — Encounter: Payer: Self-pay | Admitting: Radiation Oncology

## 2016-02-12 VITALS — BP 116/74 | HR 70 | Temp 98.0°F | Ht 60.0 in | Wt 194.1 lb

## 2016-02-12 DIAGNOSIS — C50912 Malignant neoplasm of unspecified site of left female breast: Secondary | ICD-10-CM | POA: Insufficient documentation

## 2016-02-12 DIAGNOSIS — C50412 Malignant neoplasm of upper-outer quadrant of left female breast: Secondary | ICD-10-CM

## 2016-02-12 DIAGNOSIS — Z510 Encounter for antineoplastic radiation therapy: Secondary | ICD-10-CM | POA: Insufficient documentation

## 2016-02-12 NOTE — Progress Notes (Addendum)
Radiation Oncology         [phone] ________________________________  Name: Brooke Garrison MRN: [id number]  Date: 02/12/2016  DOB: 1961/04/26  KV:QQVZD,GLOVF H, MD  Larence Penning, MD     REFERRING PHYSICIAN: Larence Penning, MD   DIAGNOSIS: The encounter diagnosis was Breast cancer of upper-outer quadrant of left female breast ([place]).  Breast cancer of the upper outer quadrant of the left female breast.  HISTORY OF PRESENT ILLNESS::Brooke Garrison is a 55 y.o. female who is seen for an initial consultation visit. She saw Dr. Pablo Ledger in breast clinic 10/04/2015. She had a palpable mass in her left breast by her GYN during a routine visit. She had a diagnostic mammogram and ultrasound 09/27/2015, revealing a 2.3 cm mass in the 3:00 position retroareolar left breast. She had a biopsy 09/27/2015 that showed invasive and in situ ductal carcinoma in the 3:00 position and lymphovascular invasion identified. Pathology confirmed the mass was ER (95%), PR (0%), Her2-neu negative, and Ki 67 (60%). She had Oncotype testing performed and her score came back as a 55, showing to be high risk. She started chemotherapy 10/20/2015 and is receiving TC every 3 weeks. She had an MRI of the breast that showed a 3.5 cm left breast malignancy at 3:00 on a background of innumerable enhancing bilateral foci and an additional mass 19 x 11 x 13 mm at the 2:00 position of the left breast. She had an MRI guided biopsy of the second mass in the left breast 11/03/2015 that showed invasive ductal carcinoma, ER (100%), PR (0%), Her2-neu negative, and Ki 67 (5%). She had a lumpectomy 01/17/2016 that revealed invasive ductal carcinoma, 2.1 cm, 1/4 positive lymph nodes, the positive one in the left axillary region. She had a left medial margin breast excision with Dr. 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  She denies lymphedema but has swelling in hands and lower legs. She has RA and has had severe pain in her hands, left knee, and feet that started 01/31/2016. She is taking naproxen 500 mg daily, tramadol 50 mg once daily and has started methotrexate a week and a half ago. Her rheumatologist is prescribing the methotrexate. She took the steri strips off her surgical site 3 days ago. She is here today with her husband.    PREVIOUS RADIATION THERAPY: No   PAST MEDICAL HISTORY:  has a past medical history of Arthritis; Breast cancer ([place]); Breast cancer of upper-outer quadrant of left female breast ([place]) (09/29/2015); GERD (gastroesophageal reflux disease); Hypothyroidism; and Thyroid disease.     PAST SURGICAL HISTORY: Past Surgical History:  Procedure Laterality Date  . ABDOMINAL HYSTERECTOMY     partial  . RADIOACTIVE SEED GUIDED MASTECTOMY WITH AXILLARY SENTINEL LYMPH NODE BIOPSY Left 01/17/2016   Procedure: DOUBLE RADIOACTIVE SEED GUIDED LEFT BREAST LUMPECTOMY AND LEFT AXILLARY SENTINEL LYMPH NODE BIOPSY ;  Surgeon: Rolm Bookbinder, MD;  Location: [facility];  Service: General;  Laterality: Left;  . RE-EXCISION OF BREAST LUMPECTOMY Left 01/23/2016   Procedure: RE-EXCISION LEFT BREAST LUMPECTOMY;  Surgeon: Rolm Bookbinder, MD;  Location: [facility];  Service: General;  Laterality: Left;     FAMILY HISTORY: family history includes Hodgkin's lymphoma in her  mother; Melanoma in her maternal grandfather.   SOCIAL HISTORY:  reports that she has never smoked. She has never used smokeless tobacco. She reports that she drinks alcohol. She reports that she does not use drugs.   ALLERGIES: Sulfa antibiotics   MEDICATIONS:  Current Outpatient Prescriptions  Medication Sig Dispense Refill  .  acyclovir (ZOVIRAX) 400 MG tablet Take 800 mg by mouth daily.  10  . liothyronine (CYTOMEL) 5 MCG tablet Take 5 mcg by mouth daily.  5  . methotrexate 2.5 MG tablet Take by mouth 3 (three) times a week. Taking 6 tablets once a week    . naproxen (NAPROSYN) 500 MG tablet Take 500 mg by mouth daily.    Marland Kitchen omeprazole (PRILOSEC) 20 MG capsule Take 1 capsule (20 mg total) by mouth daily. 30 capsule 1  . TIROSINT 75 MCG CAPS TAKE ONE TABLET 6 DAYS A WEEK  6  . traMADol (ULTRAM) 50 MG tablet Take 50 mg by mouth daily.    . cetirizine (ZYRTEC) 10 MG tablet Take 10 mg by mouth daily.    Marland Kitchen ibuprofen (ADVIL,MOTRIN) 200 MG tablet Take 200 mg by mouth every 6 (six) hours as needed.    . ondansetron (ZOFRAN) 8 MG tablet Take 1 tablet (8 mg total) by mouth 2 (two) times daily as needed for refractory nausea / vomiting. Start on day 3 after chemo. (Patient not taking: Reported on 12/22/2015) 30 tablet 1  . oxyCODONE (OXY IR/ROXICODONE) 5 MG immediate release tablet Take 1 tablet (5 mg total) by mouth every 6 (six) hours as needed for moderate pain, severe pain or breakthrough pain. (Patient not taking: Reported on 01/19/2016) 20 tablet 0  . oxyCODONE (OXY IR/ROXICODONE) 5 MG immediate release tablet Take 1-2 tablets (5-10 mg total) by mouth every 6 (six) hours as needed for moderate pain, severe pain or breakthrough pain. (Patient not taking: Reported on 02/12/2016) 20 tablet 0  . prochlorperazine (COMPAZINE) 10 MG tablet Take 1 tablet (10 mg total) by mouth every 6 (six) hours as needed (Nausea or vomiting). (Patient not taking: Reported on 01/19/2016) 30 tablet 1  . zolpidem (AMBIEN) 5 MG tablet Take 1 tablet (5 mg total) by mouth at bedtime as needed for sleep. (Patient not taking: Reported on 01/19/2016) 30 tablet 0   No current facility-administered medications for this encounter.      REVIEW OF SYSTEMS:  A 15 point review of systems is documented in the electronic medical record. This was obtained by the nursing  staff. However, I reviewed this with the patient to discuss relevant findings and make appropriate changes.  Pertinent items are noted in HPI.    PHYSICAL EXAM:  height is 5' (1.524 m) and weight is 194 lb 1.6 oz (88 kg). Her oral temperature is 98 F (36.7 C). Her blood pressure is 116/74 and her pulse is 70. Her oxygen saturation is 100%.  The heart has a regular rhythm and rate. The lungs are clear to auscultation bilaterally. Well healed incision in the breast and left axilla.   ECOG = 1  0 - Asymptomatic (Fully active, able to carry on all predisease activities without restriction)  1 - Symptomatic but completely ambulatory (Restricted in physically strenuous activity but ambulatory and able to carry out work of a light or sedentary nature. For example, light housework, office work)  2 - Symptomatic, <50% in bed during the day (Ambulatory and capable of all self care but unable to carry out any work activities. Up and  about more than 50% of waking hours)  3 - Symptomatic, >50% in bed, but not bedbound (Capable of only limited self-care, confined to bed or chair 50% or more of waking hours)  4 - Bedbound (Completely disabled. Cannot carry on any self-care. Totally confined to bed or chair)  5 - Death   Eustace Pen MM, Creech RH, Tormey DC, et al. [id number]). "Toxicity and response criteria of the [facility]". [place] Oncol. 5 (6): 649-55    LABORATORY DATA:  Lab Results  Component Value Date   WBC 12.5 (H) 01/19/2016   HGB 10.5 (L) 01/19/2016   HCT 32.5 (L) 01/19/2016   MCV 97.5 01/19/2016   PLT 253 01/19/2016   Lab Results  Component Value Date   NA 143 01/19/2016   K 3.9 01/19/2016   CO2 28 01/19/2016   Lab Results  Component Value Date   ALT 25 01/19/2016   AST 19 01/19/2016   ALKPHOS 67 01/19/2016   BILITOT 0.51 01/19/2016      RADIOGRAPHY: Nm Sentinel Node Inj-no Rpt (breast)  Result Date: 01/17/2016 CLINICAL DATA: left ax sn biopsy Sulfur  colloid was injected intradermally by the nuclear medicine technologist for breast cancer sentinel node localization.   Mm Breast Surgical Specimen  Result Date: 01/17/2016 CLINICAL DATA:  Patient with multifocal left breast carcinoma status post lumpectomy after earlier radioactive seed localizations. EXAM: SPECIMEN RADIOGRAPH OF THE LEFT BREAST COMPARISON:  Previous exam(s). FINDINGS: Status post excision of the left breast. The 2 radioactive seeds and biopsy marker clips are present, completely intact, and were marked for pathology. The positions of each seed and each biopsy marker clip within the specimen was discussed with the OR staff during the procedure. IMPRESSION: Specimen radiograph of the left breast. Electronically Signed   By: Franki Cabot M.D.   On: 01/17/2016 14:19       IMPRESSION: Ms. 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 Staged as T2N1M0. She has completed lumpectomy. This was followed by reexcision for a positive margin. The final reexcision did not reveal any evidence of invasive carcinoma. DCIS was present at less than 0.1 cm from the posterior/superior margin as it was labeled. DCIS also was within 1-2 mm to the lateral margin. The patient does see Dr. Donne Hazel tomorrow she states. She is is a good candidate for external beam radiotherapy at the appropriate time. She is a potential good candidate for prone positioning for her XRT.   PLAN: I spoke to the patient today regarding her diagnosis and options for treatment. We discussed 6.5 weeks of treatment as an outpatient. We discussed the possibility of asymptomatic lung damage. We discussed the possible side effects including but not limited to skin redness, fatigue, permanent skin darkening, and breast swelling.   I recommend she keeps taking methotrexate currently. We discussed that she should ideally stop taking  the methotrexate during radiation. We discussed the negative effects of methotrexate on radiation and the increase in skin irritation from the treatment.  The patient does see Dr. Donne Hazel tomorrow and I will reach out to him to see what his thoughts are about her final margin.    ________________________________   Jodelle Gross, MD, PhD    This document serves as a record of services personally performed by Kyung Rudd, MD. It was created on his behalf by Lendon Collar, a trained medical scribe.  The creation of this record is based on the scribe's personal observations and the provider's statements to them. This document has been checked and approved by the attending provider.

## 2016-02-12 NOTE — Progress Notes (Signed)
Please see the Nurse Progress Note in the MD Initial Consult Encounter for this patient. 

## 2016-02-13 ENCOUNTER — Encounter: Payer: Self-pay | Admitting: General Practice

## 2016-02-13 NOTE — Progress Notes (Signed)
Spiritual Care Note  Followed up with Pam by phone per agreement.  She was down because she's suffering the pain of (currently unmedicated) rheumatoid arthritis on top of the question about how much work she will be physically able to handle.  Per pt, she has been looking forward to returning to the normalcy of work after surgery, and she chose [facility] for her care because she works in [place].  If she gets radiation without returning to work, then she will be driving all the way from [place] for only a few minutes' need each day  Provided empathic listening, normalization of feelings, and emotional support.  Following, and family knows to reach out as desired, but please also page if needs arise/circumstances change.  Thank you.  [place], [place], [facility] Pager [phone] Voicemail [phone]

## 2016-02-14 ENCOUNTER — Ambulatory Visit: Payer: Managed Care, Other (non HMO)

## 2016-02-14 ENCOUNTER — Ambulatory Visit
Admission: RE | Admit: 2016-02-14 | Payer: Managed Care, Other (non HMO) | Source: Ambulatory Visit | Admitting: Radiation Oncology

## 2016-02-14 ENCOUNTER — Ambulatory Visit: Payer: Managed Care, Other (non HMO) | Admitting: Radiation Oncology

## 2016-02-14 NOTE — Addendum Note (Signed)
Encounter addended by: Kyung Rudd, MD on: 02/14/2016  7:14 AM<BR>    Actions taken: Sign clinical note

## 2016-02-14 NOTE — Addendum Note (Signed)
Encounter addended by: Kyung Rudd, MD on: 02/14/2016  7:00 AM<BR>    Actions taken: Sign clinical note

## 2016-02-16 ENCOUNTER — Ambulatory Visit
Admission: RE | Admit: 2016-02-16 | Discharge: 2016-02-16 | Disposition: A | Discharge status: Home / Self Care | Payer: Managed Care, Other (non HMO) | Source: Ambulatory Visit | Attending: Radiation Oncology | Admitting: Radiation Oncology

## 2016-02-16 DIAGNOSIS — Z510 Encounter for antineoplastic radiation therapy: Secondary | ICD-10-CM | POA: Diagnosis not present

## 2016-02-16 DIAGNOSIS — C50412 Malignant neoplasm of upper-outer quadrant of left female breast: Secondary | ICD-10-CM

## 2016-02-18 NOTE — Progress Notes (Addendum)
Name: Brooke Garrison   MRN: [id number]  Date:  02/18/2016  DOB: [birth year]  Status:outpatient    DIAGNOSIS: Breast cancer.  CONSENT VERIFIED: yes   SET UP: Patient is setup prone   IMMOBILIZATION:  The following immobilization was used: prone breast board   NARRATIVE: Ms. Padrick was brought to the [place].  Identity was confirmed.  All relevant records and images related to the planned course of therapy were reviewed.  Then, the patient was positioned in a stable reproducible clinical set-up for radiation therapy on the prone breast board for treatment to the left breast with tangent fields.    A wire was placed on the scar.  CT images were obtained.  An isocenter was placed. Skin markings were placed.  The CT images were loaded into the planning software where the target and avoidance structures were contoured.  The radiation prescription was entered and confirmed. The patient was discharged in stable condition and tolerated simulation well.    TREATMENT PLANNING NOTE:  Treatment planning then occurred. I have requested : MLC's, 3-D conformal plan. The dose volume histograms of the target, lungs, and heart will be carefully reviewed as part of the 3-D conformal treatment planning process.   ------------------------------------------------  Jodelle Gross, MD, PhD

## 2016-02-19 ENCOUNTER — Other Ambulatory Visit: Payer: Self-pay | Admitting: Hematology

## 2016-02-19 ENCOUNTER — Ambulatory Visit: Payer: Managed Care, Other (non HMO) | Admitting: Radiation Oncology

## 2016-02-19 ENCOUNTER — Other Ambulatory Visit: Payer: Self-pay | Admitting: *Deleted

## 2016-02-19 ENCOUNTER — Telehealth: Payer: Self-pay | Admitting: *Deleted

## 2016-02-19 NOTE — Telephone Encounter (Signed)
Patient called as requested by Dr. Burr Medico.  Her radiation will finish on September 28 and she will need an appt. With Dr. Burr Medico.  Let her know that I will notify Dr. Burr Medico and she should receive call from schedulers by the end of this week with next appt.with Dr. Burr Medico.  Let her know to call us if she does not hear from schedulers.

## 2016-02-19 NOTE — Telephone Encounter (Signed)
POF sent for her f/u appointments.   Brooke Garrison  02/19/2016

## 2016-02-21 ENCOUNTER — Other Ambulatory Visit: Payer: Self-pay | Admitting: *Deleted

## 2016-02-22 DIAGNOSIS — Z510 Encounter for antineoplastic radiation therapy: Secondary | ICD-10-CM | POA: Diagnosis not present

## 2016-02-23 ENCOUNTER — Ambulatory Visit
Admission: RE | Admit: 2016-02-23 | Discharge: 2016-02-23 | Disposition: A | Discharge status: Home / Self Care | Payer: Managed Care, Other (non HMO) | Source: Ambulatory Visit | Attending: Radiation Oncology | Admitting: Radiation Oncology

## 2016-02-23 DIAGNOSIS — Z510 Encounter for antineoplastic radiation therapy: Secondary | ICD-10-CM | POA: Diagnosis not present

## 2016-02-25 ENCOUNTER — Telehealth: Payer: Self-pay | Admitting: Hematology

## 2016-02-25 NOTE — Telephone Encounter (Signed)
S/w pt, gv appt 9/29 @ 2.45pm.

## 2016-02-26 ENCOUNTER — Ambulatory Visit
Admission: RE | Admit: 2016-02-26 | Discharge: 2016-02-26 | Disposition: A | Discharge status: Home / Self Care | Payer: Managed Care, Other (non HMO) | Source: Ambulatory Visit | Attending: Radiation Oncology | Admitting: Radiation Oncology

## 2016-02-26 DIAGNOSIS — C50012 Malignant neoplasm of nipple and areola, left female breast: Principal | ICD-10-CM

## 2016-02-26 DIAGNOSIS — Z510 Encounter for antineoplastic radiation therapy: Secondary | ICD-10-CM | POA: Diagnosis not present

## 2016-02-26 DIAGNOSIS — C50011 Malignant neoplasm of nipple and areola, right female breast: Secondary | ICD-10-CM

## 2016-02-26 MED ORDER — RADIAPLEXRX EX GEL
Freq: Once | CUTANEOUS | Status: AC
  Administered 2016-02-26: 13:00:00 via TOPICAL

## 2016-02-26 MED ORDER — ALRA NON-METALLIC DEODORANT (RAD-ONC)
1.0000 "application " | Freq: Once | TOPICAL | Status: AC
  Administered 2016-02-26: 1 via TOPICAL

## 2016-02-26 NOTE — Progress Notes (Signed)
Pt education,, radiation therapy and you book, alra, radiaplex gel cream, my business card given to patient,  Discussed ways to manage side effects, skin irritqtion, fatigue, swelling and soreness  Breast, use radiaplex and alra after rad tx and bedtime daily, no electric razor use, increase protein in diet, stay hydrated, drink plenty of water,verbal understaning, teach back given 12:30 PM

## 2016-02-27 ENCOUNTER — Ambulatory Visit
Admission: RE | Admit: 2016-02-27 | Discharge: 2016-02-27 | Disposition: A | Discharge status: Home / Self Care | Payer: Managed Care, Other (non HMO) | Source: Ambulatory Visit | Attending: Radiation Oncology | Admitting: Radiation Oncology

## 2016-02-27 DIAGNOSIS — Z510 Encounter for antineoplastic radiation therapy: Secondary | ICD-10-CM | POA: Diagnosis not present

## 2016-02-28 ENCOUNTER — Ambulatory Visit
Admission: RE | Admit: 2016-02-28 | Discharge: 2016-02-28 | Disposition: A | Discharge status: Home / Self Care | Payer: Managed Care, Other (non HMO) | Source: Ambulatory Visit | Attending: Radiation Oncology | Admitting: Radiation Oncology

## 2016-02-28 DIAGNOSIS — Z510 Encounter for antineoplastic radiation therapy: Secondary | ICD-10-CM | POA: Diagnosis not present

## 2016-02-29 ENCOUNTER — Ambulatory Visit
Admission: RE | Admit: 2016-02-29 | Discharge: 2016-02-29 | Disposition: A | Discharge status: Home / Self Care | Payer: Managed Care, Other (non HMO) | Source: Ambulatory Visit | Attending: Radiation Oncology | Admitting: Radiation Oncology

## 2016-02-29 DIAGNOSIS — Z510 Encounter for antineoplastic radiation therapy: Secondary | ICD-10-CM | POA: Diagnosis not present

## 2016-03-01 ENCOUNTER — Encounter: Payer: Self-pay | Admitting: Radiation Oncology

## 2016-03-01 ENCOUNTER — Encounter: Payer: Self-pay | Admitting: *Deleted

## 2016-03-01 ENCOUNTER — Ambulatory Visit
Admission: RE | Admit: 2016-03-01 | Discharge: 2016-03-01 | Disposition: A | Discharge status: Home / Self Care | Payer: Managed Care, Other (non HMO) | Source: Ambulatory Visit | Attending: Radiation Oncology | Admitting: Radiation Oncology

## 2016-03-01 VITALS — BP 119/67 | HR 82 | Temp 98.3°F | Ht 60.0 in | Wt 190.7 lb

## 2016-03-01 DIAGNOSIS — C50412 Malignant neoplasm of upper-outer quadrant of left female breast: Secondary | ICD-10-CM

## 2016-03-01 DIAGNOSIS — Z510 Encounter for antineoplastic radiation therapy: Secondary | ICD-10-CM | POA: Diagnosis not present

## 2016-03-01 NOTE — Progress Notes (Signed)
Department of Radiation Oncology  Phone:  [phone] Fax:        [phone]  Weekly Treatment Note    Name: Brooke Garrison Date: 03/01/2016 MRN: [id number] DOB: [birth year]   Diagnosis:     ICD-9-CM ICD-10-CM   1. Breast cancer of upper-outer quadrant of left female breast ([place]) 174.4 C50.412      Current dose: 9 Gy  Current fraction: 5   MEDICATIONS: Current Outpatient Prescriptions  Medication Sig Dispense Refill  . acyclovir (ZOVIRAX) 400 MG tablet Take 800 mg by mouth daily.  10  . liothyronine (CYTOMEL) 5 MCG tablet Take 5 mcg by mouth daily.  5  . methotrexate 2.5 MG tablet Take by mouth 3 (three) times a week. Taking 6 tablets once a week    . naproxen (NAPROSYN) 500 MG tablet Take 500 mg by mouth daily.    Marland Kitchen omeprazole (PRILOSEC) 20 MG capsule Take 1 capsule (20 mg total) by mouth daily. 30 capsule 1  . oxyCODONE (OXY IR/ROXICODONE) 5 MG immediate release tablet Take 1 tablet (5 mg total) by mouth every 6 (six) hours as needed for moderate pain, severe pain or breakthrough pain. 20 tablet 0  . oxyCODONE (OXY IR/ROXICODONE) 5 MG immediate release tablet Take 1-2 tablets (5-10 mg total) by mouth every 6 (six) hours as needed for moderate pain, severe pain or breakthrough pain. 20 tablet 0  . TIROSINT 75 MCG CAPS TAKE ONE TABLET 6 DAYS A WEEK  6  . traMADol (ULTRAM) 50 MG tablet Take 50 mg by mouth daily.    . cetirizine (ZYRTEC) 10 MG tablet Take 10 mg by mouth daily.    Marland Kitchen ibuprofen (ADVIL,MOTRIN) 200 MG tablet Take 200 mg by mouth every 6 (six) hours as needed.    . ondansetron (ZOFRAN) 8 MG tablet Take 1 tablet (8 mg total) by mouth 2 (two) times daily as needed for refractory nausea / vomiting. Start on day 3 after chemo. (Patient not taking: Reported on 03/01/2016) 30 tablet 1  . prochlorperazine (COMPAZINE) 10 MG tablet Take 1 tablet (10 mg total) by mouth every 6 (six) hours as needed (Nausea or vomiting). (Patient not taking: Reported on 01/19/2016) 30 tablet  1  . zolpidem (AMBIEN) 5 MG tablet Take 1 tablet (5 mg total) by mouth at bedtime as needed for sleep. (Patient not taking: Reported on 01/19/2016) 30 tablet 0   No current facility-administered medications for this encounter.      ALLERGIES: Sulfa antibiotics   LABORATORY DATA:  Lab Results  Component Value Date   WBC 12.5 (H) 01/19/2016   HGB 10.5 (L) 01/19/2016   HCT 32.5 (L) 01/19/2016   MCV 97.5 01/19/2016   PLT 253 01/19/2016   Lab Results  Component Value Date   NA 143 01/19/2016   K 3.9 01/19/2016   CO2 28 01/19/2016   Lab Results  Component Value Date   ALT 25 01/19/2016   AST 19 01/19/2016   ALKPHOS 67 01/19/2016   BILITOT 0.51 01/19/2016     NARRATIVE: Brooke Garrison was seen today for weekly treatment management. The chart was checked and the patient's films were reviewed.  Brooke Garrison is here for her 5th fraction of radiation to her Left Breast. She denies pain over her Left Breast. She does have chronic pain from Rheumatoid Arthritis which she takes naproxen, tramadol, and oxycodone (at night) for. Her Left Breast is normal appearing, but does tell me a few days ago it was slightly pink, but  it has improved. She is using the Radiaplex cream as directed.   BP 119/67   Pulse 82   Temp 98.3 F (36.8 C)   Ht 5' (1.524 m)   Wt 190 lb 11.2 oz (86.5 kg)   LMP 01/08/2006   SpO2 98% Comment: room air  BMI 37.24 kg/m    Wt Readings from Last 3 Encounters:  03/01/16 190 lb 11.2 oz (86.5 kg)  02/12/16 194 lb 1.6 oz (88 kg)  01/23/16 193 lb (87.5 kg)    PHYSICAL EXAMINATION: height is 5' (1.524 m) and weight is 190 lb 11.2 oz (86.5 kg). Her temperature is 98.3 F (36.8 C). Her blood pressure is 119/67 and her pulse is 82. Her oxygen saturation is 98%.        ASSESSMENT: The patient is doing satisfactorily with treatment.  PLAN: We will continue with the patient's radiation treatment as planned.

## 2016-03-01 NOTE — Progress Notes (Signed)
Brooke Garrison is here for her 5th fraction of radiation to her Left Breast. She denies pain over her Left Breast. She does have chronic pain from Rheumatoid Arthritis which she takes naproxen, tramadol, and oxycodone (at night) for. Her Left Breast is normal appearing, but does tell me a few days ago it was slightly pink, but it has improved. She is using the Radiaplex cream as directed.   BP 119/67   Pulse 82   Temp 98.3 F (36.8 C)   Ht 5' (1.524 m)   Wt 190 lb 11.2 oz (86.5 kg)   LMP 01/08/2006   SpO2 98% Comment: room air  BMI 37.24 kg/m    Wt Readings from Last 3 Encounters:  03/01/16 190 lb 11.2 oz (86.5 kg)  02/12/16 194 lb 1.6 oz (88 kg)  01/23/16 193 lb (87.5 kg)

## 2016-03-01 NOTE — Progress Notes (Signed)
Brooke Garrison Psychosocial Distress Screening Clinical Social Work  Clinical Social Work was referred by distress screening protocol.  The patient scored a 8 on the Psychosocial Distress Thermometer which indicates severe distress. Clinical Social Worker phoned pt to assess for distress and other psychosocial needs. CSW spoke with pt at length and educated her on support programs and financial resource options. Pt reports she works in [place], but lives in [place]. Pt reports she qualified for the [employer] educated her on additional resources that can help with financial support. CSW putting a packet together of these additional resources and will leave them at her treatment area for her to pick up later today. Pt appreciated supportive listening and additional resources.   ONCBCN DISTRESS SCREENING 02/12/2016  Screening Type Initial Screening  Distress experienced in past week (1-10) 8  Practical problem type Work/school  Emotional problem type Adjusting to illness;Feeling hopeless;Adjusting to appearance changes  Physical Problem type Pain;Sleep/insomnia;Getting around  Referral to clinical social work   Referral to support programs    Clinical Social Worker follow up needed: Yes.    If yes, follow up plan: See above Brooke Garrison, [place] Worker [place]  [facility] Phone: [phone] Fax: [phone]

## 2016-03-04 ENCOUNTER — Ambulatory Visit
Admission: RE | Admit: 2016-03-04 | Discharge: 2016-03-04 | Disposition: A | Discharge status: Home / Self Care | Payer: Managed Care, Other (non HMO) | Source: Ambulatory Visit | Attending: Radiation Oncology | Admitting: Radiation Oncology

## 2016-03-04 DIAGNOSIS — Z510 Encounter for antineoplastic radiation therapy: Secondary | ICD-10-CM | POA: Diagnosis not present

## 2016-03-05 ENCOUNTER — Ambulatory Visit
Admission: RE | Admit: 2016-03-05 | Discharge: 2016-03-05 | Disposition: A | Discharge status: Home / Self Care | Payer: Managed Care, Other (non HMO) | Source: Ambulatory Visit | Attending: Radiation Oncology | Admitting: Radiation Oncology

## 2016-03-05 DIAGNOSIS — Z510 Encounter for antineoplastic radiation therapy: Secondary | ICD-10-CM | POA: Diagnosis not present

## 2016-03-06 ENCOUNTER — Telehealth: Payer: Self-pay | Admitting: *Deleted

## 2016-03-06 ENCOUNTER — Ambulatory Visit
Admission: RE | Admit: 2016-03-06 | Discharge: 2016-03-06 | Disposition: A | Discharge status: Home / Self Care | Payer: Managed Care, Other (non HMO) | Source: Ambulatory Visit | Attending: Radiation Oncology | Admitting: Radiation Oncology

## 2016-03-06 DIAGNOSIS — Z510 Encounter for antineoplastic radiation therapy: Secondary | ICD-10-CM | POA: Diagnosis not present

## 2016-03-06 NOTE — Telephone Encounter (Signed)
  Oncology Nurse Navigator Documentation    Navigator Encounter Type: Telephone (03/06/16 1100) Telephone: Lahoma Crocker Call (Contact information provided.) (03/06/16 1100)         Patient Visit Type: [id number] (Left vm for pt to assess needs during xrt.) (03/06/16 1100) Treatment Phase: First Radiation Tx (03/06/16 1100)                            Time Spent with Patient: 15 (03/06/16 1100)

## 2016-03-07 ENCOUNTER — Ambulatory Visit
Admission: RE | Admit: 2016-03-07 | Discharge: 2016-03-07 | Disposition: A | Discharge status: Home / Self Care | Payer: Managed Care, Other (non HMO) | Source: Ambulatory Visit | Attending: Radiation Oncology | Admitting: Radiation Oncology

## 2016-03-07 DIAGNOSIS — Z510 Encounter for antineoplastic radiation therapy: Secondary | ICD-10-CM | POA: Diagnosis not present

## 2016-03-08 ENCOUNTER — Encounter: Payer: Self-pay | Admitting: Radiation Oncology

## 2016-03-08 ENCOUNTER — Ambulatory Visit
Admission: RE | Admit: 2016-03-08 | Discharge: 2016-03-08 | Disposition: A | Discharge status: Home / Self Care | Payer: Managed Care, Other (non HMO) | Source: Ambulatory Visit | Attending: Radiation Oncology | Admitting: Radiation Oncology

## 2016-03-08 VITALS — BP 118/71 | HR 81 | Temp 98.2°F | Ht 60.0 in | Wt 189.9 lb

## 2016-03-08 DIAGNOSIS — C50412 Malignant neoplasm of upper-outer quadrant of left female breast: Secondary | ICD-10-CM

## 2016-03-08 DIAGNOSIS — Z510 Encounter for antineoplastic radiation therapy: Secondary | ICD-10-CM | POA: Diagnosis not present

## 2016-03-08 NOTE — Progress Notes (Signed)
Department of Radiation Oncology  Phone:  [phone] Fax:        [phone]  Weekly Treatment Note    Name: Brooke Garrison Date: 03/10/2016 MRN: [id number] DOB: Jun 19, 1961   Diagnosis:     ICD-9-CM ICD-10-CM   1. Breast cancer of upper-outer quadrant of left female breast ([place]) 174.4 C50.412      Current dose: 18 Gy  Current fraction: 10   MEDICATIONS: Current Outpatient Prescriptions  Medication Sig Dispense Refill  . acyclovir (ZOVIRAX) 400 MG tablet Take 800 mg by mouth daily.  10  . cetirizine (ZYRTEC) 10 MG tablet Take 10 mg by mouth daily.    Marland Kitchen ibuprofen (ADVIL,MOTRIN) 200 MG tablet Take 200 mg by mouth every 6 (six) hours as needed.    Marland Kitchen liothyronine (CYTOMEL) 5 MCG tablet Take 5 mcg by mouth daily.  5  . methotrexate 2.5 MG tablet Take by mouth 3 (three) times a week. Taking 6 tablets once a week    . naproxen (NAPROSYN) 500 MG tablet Take 500 mg by mouth daily.    Marland Kitchen omeprazole (PRILOSEC) 20 MG capsule Take 1 capsule (20 mg total) by mouth daily. 30 capsule 1  . oxyCODONE (OXY IR/ROXICODONE) 5 MG immediate release tablet Take 1-2 tablets (5-10 mg total) by mouth every 6 (six) hours as needed for moderate pain, severe pain or breakthrough pain. 20 tablet 0  . TIROSINT 75 MCG CAPS TAKE ONE TABLET 6 DAYS A WEEK  6  . traMADol (ULTRAM) 50 MG tablet Take 50 mg by mouth daily.    . ondansetron (ZOFRAN) 8 MG tablet Take 1 tablet (8 mg total) by mouth 2 (two) times daily as needed for refractory nausea / vomiting. Start on day 3 after chemo. (Patient not taking: Reported on 03/01/2016) 30 tablet 1  . oxyCODONE (OXY IR/ROXICODONE) 5 MG immediate release tablet Take 1 tablet (5 mg total) by mouth every 6 (six) hours as needed for moderate pain, severe pain or breakthrough pain. (Patient not taking: Reported on 03/08/2016) 20 tablet 0  . prochlorperazine (COMPAZINE) 10 MG tablet Take 1 tablet (10 mg total) by mouth every 6 (six) hours as needed (Nausea or vomiting).  (Patient not taking: Reported on 01/19/2016) 30 tablet 1  . zolpidem (AMBIEN) 5 MG tablet Take 1 tablet (5 mg total) by mouth at bedtime as needed for sleep. (Patient not taking: Reported on 03/08/2016) 30 tablet 0   No current facility-administered medications for this encounter.      ALLERGIES: Sulfa antibiotics   LABORATORY DATA:  Lab Results  Component Value Date   WBC 12.5 (H) 01/19/2016   HGB 10.5 (L) 01/19/2016   HCT 32.5 (L) 01/19/2016   MCV 97.5 01/19/2016   PLT 253 01/19/2016   Lab Results  Component Value Date   NA 143 01/19/2016   K 3.9 01/19/2016   CO2 28 01/19/2016   Lab Results  Component Value Date   ALT 25 01/19/2016   AST 19 01/19/2016   ALKPHOS 67 01/19/2016   BILITOT 0.51 01/19/2016     NARRATIVE: Brooke Garrison was seen today for weekly treatment management. The chart was checked and the patient's films were reviewed.  Brooke Garrison is here for her 10th fraction of radiation to her Left Breast. She reports pain as a 6/10 related to her Rheumatoid Arthritis. She has fatigue, but relates it to lack of sleep because she can't lay still for very long. Her Left Breast is slightly red and she denies  tenderness. She is using the Radiaplex twice daily as directed.  BP 118/71   Pulse 81   Temp 98.2 F (36.8 C)   Ht 5' (1.524 m)   Wt 189 lb 14.4 oz (86.1 kg)   LMP 01/08/2006   SpO2 99% Comment: room air  BMI 37.09 kg/m    Wt Readings from Last 3 Encounters:  03/08/16 189 lb 14.4 oz (86.1 kg)  03/01/16 190 lb 11.2 oz (86.5 kg)  02/12/16 194 lb 1.6 oz (88 kg)    PHYSICAL EXAMINATION: height is 5' (1.524 m) and weight is 189 lb 14.4 oz (86.1 kg). Her temperature is 98.2 F (36.8 C). Her blood pressure is 118/71 and her pulse is 81. Her oxygen saturation is 99%.       Mild erythema in the left breast treatment area.  ASSESSMENT: The patient is doing satisfactorily with treatment.  PLAN: We will continue with the patient's radiation treatment as  planned.   This document serves as a record of services personally performed by Kyung Rudd, MD. It was created on his behalf by Brooke Garrison, a trained medical scribe. The creation of this record is based on the scribe's personal observations and the provider's statements to them. This document has been checked and approved by the attending provider.

## 2016-03-08 NOTE — Progress Notes (Signed)
Brooke Garrison is here for her 10th fraction of radiation to her Left Breast. She reports a pain 6/10 related to her Rheumatoid Arthritis. She has fatigue but relates it to lack of sleep, because she can't lay still for very long. Her Left Breast is slightly red, and she denies tenderness. She is using the Radiaplex twice daily as directed.   BP 118/71   Pulse 81   Temp 98.2 F (36.8 C)   Ht 5' (1.524 m)   Wt 189 lb 14.4 oz (86.1 kg)   LMP 01/08/2006   SpO2 99% Comment: room air  BMI 37.09 kg/m    Wt Readings from Last 3 Encounters:  03/08/16 189 lb 14.4 oz (86.1 kg)  03/01/16 190 lb 11.2 oz (86.5 kg)  02/12/16 194 lb 1.6 oz (88 kg)

## 2016-03-11 ENCOUNTER — Ambulatory Visit
Admission: RE | Admit: 2016-03-11 | Discharge: 2016-03-11 | Disposition: A | Discharge status: Home / Self Care | Payer: Managed Care, Other (non HMO) | Source: Ambulatory Visit | Attending: Radiation Oncology | Admitting: Radiation Oncology

## 2016-03-11 DIAGNOSIS — Z510 Encounter for antineoplastic radiation therapy: Secondary | ICD-10-CM | POA: Diagnosis not present

## 2016-03-12 ENCOUNTER — Ambulatory Visit
Admission: RE | Admit: 2016-03-12 | Discharge: 2016-03-12 | Disposition: A | Discharge status: Home / Self Care | Payer: Managed Care, Other (non HMO) | Source: Ambulatory Visit | Attending: Radiation Oncology | Admitting: Radiation Oncology

## 2016-03-12 DIAGNOSIS — Z510 Encounter for antineoplastic radiation therapy: Secondary | ICD-10-CM | POA: Diagnosis not present

## 2016-03-13 ENCOUNTER — Encounter: Payer: Self-pay | Admitting: *Deleted

## 2016-03-13 ENCOUNTER — Ambulatory Visit
Admission: RE | Admit: 2016-03-13 | Discharge: 2016-03-13 | Disposition: A | Discharge status: Home / Self Care | Payer: Managed Care, Other (non HMO) | Source: Ambulatory Visit | Attending: Radiation Oncology | Admitting: Radiation Oncology

## 2016-03-13 DIAGNOSIS — Z510 Encounter for antineoplastic radiation therapy: Secondary | ICD-10-CM | POA: Diagnosis not present

## 2016-03-13 NOTE — Progress Notes (Signed)
Face to face encounter with Ms Brooke Garrison to inform her that she has experience an exposure to the flu within the Radiation Oncology department and a prescription has been called [facility] in [place] on [address]. as she requested. She expressed thanks. Tamiflu 75mg  daily x 7 days.

## 2016-03-14 ENCOUNTER — Ambulatory Visit
Admission: RE | Admit: 2016-03-14 | Discharge: 2016-03-14 | Disposition: A | Discharge status: Home / Self Care | Payer: Managed Care, Other (non HMO) | Source: Ambulatory Visit | Attending: Radiation Oncology | Admitting: Radiation Oncology

## 2016-03-14 DIAGNOSIS — Z510 Encounter for antineoplastic radiation therapy: Secondary | ICD-10-CM | POA: Diagnosis not present

## 2016-03-15 ENCOUNTER — Encounter: Payer: Self-pay | Admitting: Radiation Oncology

## 2016-03-15 ENCOUNTER — Ambulatory Visit
Admission: RE | Admit: 2016-03-15 | Discharge: 2016-03-15 | Disposition: A | Discharge status: Home / Self Care | Payer: Managed Care, Other (non HMO) | Source: Ambulatory Visit | Attending: Radiation Oncology | Admitting: Radiation Oncology

## 2016-03-15 VITALS — BP 123/66 | HR 67 | Temp 98.2°F | Ht 60.0 in | Wt 188.8 lb

## 2016-03-15 DIAGNOSIS — Z510 Encounter for antineoplastic radiation therapy: Secondary | ICD-10-CM | POA: Diagnosis not present

## 2016-03-15 DIAGNOSIS — C50412 Malignant neoplasm of upper-outer quadrant of left female breast: Secondary | ICD-10-CM

## 2016-03-15 NOTE — Progress Notes (Signed)
Ms. Has received 15 fractions to her left breast.  She denies any pain nor changes in pigmentation. Difficulty sleeping due to arthritis pain, therefore, napping during the day.  BP 123/66 (BP Location: Right Arm, Patient Position: Sitting, Cuff Size: Large)   Pulse 67   Temp 98.2 F (36.8 C) (Oral)   Ht 5' (1.524 m)   Wt 188 lb 12.8 oz (85.6 kg)   LMP 01/08/2006   BMI 36.87 kg/m    Wt Readings from Last 3 Encounters:  03/15/16 188 lb 12.8 oz (85.6 kg)  03/08/16 189 lb 14.4 oz (86.1 kg)  03/01/16 190 lb 11.2 oz (86.5 kg)

## 2016-03-15 NOTE — Progress Notes (Signed)
Department of Radiation Oncology  Phone:  [phone] Fax:        [phone]  Weekly Treatment Note    Name: Brooke Garrison Date: 03/15/2016 MRN: [id number] DOB: 03-27-1961   Diagnosis:     ICD-9-CM ICD-10-CM   1. Breast cancer of upper-outer quadrant of left female breast ([place]) 174.4 C50.412      Current dose: 27 Gy  Current fraction: 15   MEDICATIONS: Current Outpatient Prescriptions  Medication Sig Dispense Refill  . acyclovir (ZOVIRAX) 400 MG tablet Take 800 mg by mouth daily.  10  . cetirizine (ZYRTEC) 10 MG tablet Take 10 mg by mouth daily.    Marland Kitchen ibuprofen (ADVIL,MOTRIN) 200 MG tablet Take 200 mg by mouth every 6 (six) hours as needed.    Marland Kitchen liothyronine (CYTOMEL) 5 MCG tablet Take 5 mcg by mouth daily.  5  . methotrexate 2.5 MG tablet Take by mouth 3 (three) times a week. Taking 6 tablets once a week    . naproxen (NAPROSYN) 500 MG tablet Take 500 mg by mouth daily.    Marland Kitchen omeprazole (PRILOSEC) 20 MG capsule Take 1 capsule (20 mg total) by mouth daily. 30 capsule 1  . TIROSINT 75 MCG CAPS TAKE ONE TABLET 6 DAYS A WEEK  6  . traMADol (ULTRAM) 50 MG tablet Take 50 mg by mouth daily.    . ondansetron (ZOFRAN) 8 MG tablet Take 1 tablet (8 mg total) by mouth 2 (two) times daily as needed for refractory nausea / vomiting. Start on day 3 after chemo. (Patient not taking: Reported on 03/01/2016) 30 tablet 1  . oxyCODONE (OXY IR/ROXICODONE) 5 MG immediate release tablet Take 1 tablet (5 mg total) by mouth every 6 (six) hours as needed for moderate pain, severe pain or breakthrough pain. (Patient not taking: Reported on 03/08/2016) 20 tablet 0  . oxyCODONE (OXY IR/ROXICODONE) 5 MG immediate release tablet Take 1-2 tablets (5-10 mg total) by mouth every 6 (six) hours as needed for moderate pain, severe pain or breakthrough pain. (Patient not taking: Reported on 03/15/2016) 20 tablet 0  . prochlorperazine (COMPAZINE) 10 MG tablet Take 1 tablet (10 mg total) by mouth every 6 (six)  hours as needed (Nausea or vomiting). (Patient not taking: Reported on 01/19/2016) 30 tablet 1  . zolpidem (AMBIEN) 5 MG tablet Take 1 tablet (5 mg total) by mouth at bedtime as needed for sleep. (Patient not taking: Reported on 03/08/2016) 30 tablet 0   No current facility-administered medications for this encounter.      ALLERGIES: Sulfa antibiotics   LABORATORY DATA:  Lab Results  Component Value Date   WBC 12.5 (H) 01/19/2016   HGB 10.5 (L) 01/19/2016   HCT 32.5 (L) 01/19/2016   MCV 97.5 01/19/2016   PLT 253 01/19/2016   Lab Results  Component Value Date   NA 143 01/19/2016   K 3.9 01/19/2016   CO2 28 01/19/2016   Lab Results  Component Value Date   ALT 25 01/19/2016   AST 19 01/19/2016   ALKPHOS 67 01/19/2016   BILITOT 0.51 01/19/2016     NARRATIVE: Brooke Garrison was seen today for weekly treatment management. The chart was checked and the patient's films were reviewed.  Ms. Has received 15 fractions to her left breast.  She denies any pain nor changes in pigmentation. Difficulty sleeping due to arthritis pain, therefore, napping during the day.  BP 123/66 (BP Location: Right Arm, Patient Position: Sitting, Cuff Size: Large)   Pulse 67  Temp 98.2 F (36.8 C) (Oral)   Ht 5' (1.524 m)   Wt 188 lb 12.8 oz (85.6 kg)   LMP 01/08/2006   BMI 36.87 kg/m    Wt Readings from Last 3 Encounters:  03/15/16 188 lb 12.8 oz (85.6 kg)  03/08/16 189 lb 14.4 oz (86.1 kg)  03/01/16 190 lb 11.2 oz (86.5 kg)    PHYSICAL EXAMINATION: height is 5' (1.524 m) and weight is 188 lb 12.8 oz (85.6 kg). Her oral temperature is 98.2 F (36.8 C). Her blood pressure is 123/66 and her pulse is 67.      The patient's skin shows very mild erythema  ASSESSMENT: The patient is doing satisfactorily with treatment.  PLAN: We will continue with the patient's radiation treatment as planned. The patient's skin is doing excellent. She will continue her current dose of methotrexate. I would not  increase this dose currently as she is not halfway through treatment but we could possibly discuss this further as her arthritis does continue to bother her significantly.

## 2016-03-19 ENCOUNTER — Ambulatory Visit
Admission: RE | Admit: 2016-03-19 | Discharge: 2016-03-19 | Disposition: A | Discharge status: Home / Self Care | Payer: Managed Care, Other (non HMO) | Source: Ambulatory Visit | Attending: Radiation Oncology | Admitting: Radiation Oncology

## 2016-03-19 DIAGNOSIS — Z510 Encounter for antineoplastic radiation therapy: Secondary | ICD-10-CM | POA: Diagnosis not present

## 2016-03-20 ENCOUNTER — Ambulatory Visit
Admission: RE | Admit: 2016-03-20 | Discharge: 2016-03-20 | Disposition: A | Discharge status: Home / Self Care | Payer: Managed Care, Other (non HMO) | Source: Ambulatory Visit | Attending: Radiation Oncology | Admitting: Radiation Oncology

## 2016-03-20 ENCOUNTER — Encounter: Payer: Self-pay | Admitting: Radiation Oncology

## 2016-03-20 VITALS — BP 122/84 | HR 72 | Temp 98.4°F | Resp 18 | Ht <= 58 in | Wt 188.0 lb

## 2016-03-20 DIAGNOSIS — C50412 Malignant neoplasm of upper-outer quadrant of left female breast: Secondary | ICD-10-CM

## 2016-03-20 DIAGNOSIS — Z510 Encounter for antineoplastic radiation therapy: Secondary | ICD-10-CM | POA: Diagnosis not present

## 2016-03-20 MED ORDER — RADIAPLEXRX EX GEL
Freq: Once | CUTANEOUS | Status: AC
  Administered 2016-03-20: 16:00:00 via TOPICAL

## 2016-03-20 NOTE — Progress Notes (Signed)
Department of Radiation Oncology  Phone:  [phone] Fax:        [phone]  Weekly Treatment Note    Name: Brooke Garrison Date: 03/20/2016 MRN: [id number] DOB: 01-06-1961   Diagnosis:     ICD-9-CM ICD-10-CM   1. Breast cancer of upper-outer quadrant of left female breast ([facility]) 174.4 C50.412 hyaluronate sodium (RADIAPLEXRX) gel     Current dose: 30.6 Gy  Current fraction: 17   MEDICATIONS: Current Outpatient Prescriptions  Medication Sig Dispense Refill  . acyclovir (ZOVIRAX) 400 MG tablet Take 800 mg by mouth daily.  10  . cetirizine (ZYRTEC) 10 MG tablet Take 10 mg by mouth daily.    Marland Kitchen HYDROcodone-acetaminophen (NORCO/VICODIN) 5-325 MG tablet Take 1 tablet by mouth.    . liothyronine (CYTOMEL) 5 MCG tablet Take 5 mcg by mouth daily.  5  . methotrexate 2.5 MG tablet Take by mouth 3 (three) times a week. Taking 6 tablets once a week    . naproxen (NAPROSYN) 500 MG tablet Take 500 mg by mouth daily.    Marland Kitchen omeprazole (PRILOSEC) 20 MG capsule Take 1 capsule (20 mg total) by mouth daily. 30 capsule 1  . oseltamivir (TAMIFLU) 75 MG capsule     . oxyCODONE (OXY IR/ROXICODONE) 5 MG immediate release tablet Take 1 tablet (5 mg total) by mouth every 6 (six) hours as needed for moderate pain, severe pain or breakthrough pain. 20 tablet 0  . oxyCODONE (OXY IR/ROXICODONE) 5 MG immediate release tablet Take 1-2 tablets (5-10 mg total) by mouth every 6 (six) hours as needed for moderate pain, severe pain or breakthrough pain. 20 tablet 0  . TIROSINT 75 MCG CAPS TAKE ONE TABLET 6 DAYS A WEEK  6  . traMADol (ULTRAM) 50 MG tablet Take 50 mg by mouth daily.    Marland Kitchen ibuprofen (ADVIL,MOTRIN) 200 MG tablet Take 200 mg by mouth every 6 (six) hours as needed.    . ondansetron (ZOFRAN) 8 MG tablet Take 1 tablet (8 mg total) by mouth 2 (two) times daily as needed for refractory nausea / vomiting. Start on day 3 after chemo. (Patient not taking: Reported on 03/01/2016) 30 tablet 1  .  prochlorperazine (COMPAZINE) 10 MG tablet Take 1 tablet (10 mg total) by mouth every 6 (six) hours as needed (Nausea or vomiting). (Patient not taking: Reported on 01/19/2016) 30 tablet 1  . zolpidem (AMBIEN) 5 MG tablet Take 1 tablet (5 mg total) by mouth at bedtime as needed for sleep. (Patient not taking: Reported on 03/08/2016) 30 tablet 0   No current facility-administered medications for this encounter.      ALLERGIES: Sulfa antibiotics   LABORATORY DATA:  Lab Results  Component Value Date   WBC 12.5 (H) 01/19/2016   HGB 10.5 (L) 01/19/2016   HCT 32.5 (L) 01/19/2016   MCV 97.5 01/19/2016   PLT 253 01/19/2016   Lab Results  Component Value Date   NA 143 01/19/2016   K 3.9 01/19/2016   CO2 28 01/19/2016   Lab Results  Component Value Date   ALT 25 01/19/2016   AST 19 01/19/2016   ALKPHOS 67 01/19/2016   BILITOT 0.51 01/19/2016     NARRATIVE: Brooke Garrison was seen today for weekly treatment management. The chart was checked and the patient's films were reviewed.  Mrs. Osterberg has received 17 fractions to her left  Breast.  Skin to left breast with normal color and intact.  Using Radiaplex gel bid.  Appetite is fair.  Denies pain today.  Having fatigue most of the day.   Will complete Tamiflu tomorrow. Wt Readings from Last 3 Encounters:  03/20/16 188 lb (85.3 kg)  03/15/16 188 lb 12.8 oz (85.6 kg)  03/08/16 189 lb 14.4 oz (86.1 kg)  BP 122/84 (BP Location: Right Arm, Patient Position: Sitting, Cuff Size: Normal)   Pulse 72   Temp 98.4 F (36.9 C) (Oral)   Resp 18   Ht (!) 5" (0.127 m)   Wt 188 lb (85.3 kg)   LMP 01/08/2006   SpO2 98%   BMI 5287.14 kg/m   PHYSICAL EXAMINATION: height is 5" (0.127 m) (abnormal) and weight is 188 lb (85.3 kg). Her oral temperature is 98.4 F (36.9 C). Her blood pressure is 122/84 and her pulse is 72. Her respiration is 18 and oxygen saturation is 98%.        ASSESSMENT: The patient is doing satisfactorily with  treatment.  PLAN: We will continue with the patient's radiation treatment as planned.

## 2016-03-20 NOTE — Progress Notes (Addendum)
Brooke Garrison has received 17 fractions to her left  Breast.  Skin to left breast with normal color and intact.  Using Radiaplex gel bid.  Appetite is fair.  Denies pain today.  Having fatigue most of the day.   Will complete Tamiflu tomorrow. Wt Readings from Last 3 Encounters:  03/20/16 188 lb (85.3 kg)  03/15/16 188 lb 12.8 oz (85.6 kg)  03/08/16 189 lb 14.4 oz (86.1 kg)  BP 122/84 (BP Location: Right Arm, Patient Position: Sitting, Cuff Size: Normal)   Pulse 72   Temp 98.4 F (36.9 C) (Oral)   Resp 18   Ht (!) 5" (0.127 m)   Wt 188 lb (85.3 kg)   LMP 01/08/2006   SpO2 98%   BMI 5287.14 kg/m

## 2016-03-21 ENCOUNTER — Ambulatory Visit
Admission: RE | Admit: 2016-03-21 | Discharge: 2016-03-21 | Disposition: A | Discharge status: Home / Self Care | Payer: Managed Care, Other (non HMO) | Source: Ambulatory Visit | Attending: Radiation Oncology | Admitting: Radiation Oncology

## 2016-03-21 DIAGNOSIS — Z510 Encounter for antineoplastic radiation therapy: Secondary | ICD-10-CM | POA: Diagnosis not present

## 2016-03-22 ENCOUNTER — Ambulatory Visit
Admission: RE | Admit: 2016-03-22 | Discharge: 2016-03-22 | Disposition: A | Discharge status: Home / Self Care | Payer: Managed Care, Other (non HMO) | Source: Ambulatory Visit | Attending: Radiation Oncology | Admitting: Radiation Oncology

## 2016-03-22 DIAGNOSIS — Z510 Encounter for antineoplastic radiation therapy: Secondary | ICD-10-CM | POA: Diagnosis not present

## 2016-03-25 ENCOUNTER — Ambulatory Visit
Admission: RE | Admit: 2016-03-25 | Discharge: 2016-03-25 | Disposition: A | Discharge status: Home / Self Care | Payer: Managed Care, Other (non HMO) | Source: Ambulatory Visit | Attending: Radiation Oncology | Admitting: Radiation Oncology

## 2016-03-25 DIAGNOSIS — Z510 Encounter for antineoplastic radiation therapy: Secondary | ICD-10-CM | POA: Diagnosis not present

## 2016-03-26 ENCOUNTER — Ambulatory Visit
Admission: RE | Admit: 2016-03-26 | Discharge: 2016-03-26 | Disposition: A | Discharge status: Home / Self Care | Payer: Managed Care, Other (non HMO) | Source: Ambulatory Visit | Attending: Radiation Oncology | Admitting: Radiation Oncology

## 2016-03-26 DIAGNOSIS — Z510 Encounter for antineoplastic radiation therapy: Secondary | ICD-10-CM | POA: Diagnosis not present

## 2016-03-27 ENCOUNTER — Ambulatory Visit
Admission: RE | Admit: 2016-03-27 | Discharge: 2016-03-27 | Disposition: A | Discharge status: Home / Self Care | Payer: Managed Care, Other (non HMO) | Source: Ambulatory Visit | Attending: Radiation Oncology | Admitting: Radiation Oncology

## 2016-03-27 DIAGNOSIS — Z510 Encounter for antineoplastic radiation therapy: Secondary | ICD-10-CM | POA: Diagnosis not present

## 2016-03-28 ENCOUNTER — Ambulatory Visit
Admission: RE | Admit: 2016-03-28 | Discharge: 2016-03-28 | Disposition: A | Discharge status: Home / Self Care | Payer: Managed Care, Other (non HMO) | Source: Ambulatory Visit | Attending: Radiation Oncology | Admitting: Radiation Oncology

## 2016-03-28 DIAGNOSIS — Z510 Encounter for antineoplastic radiation therapy: Secondary | ICD-10-CM | POA: Diagnosis not present

## 2016-03-29 ENCOUNTER — Ambulatory Visit
Admission: RE | Admit: 2016-03-29 | Discharge: 2016-03-29 | Disposition: A | Discharge status: Home / Self Care | Payer: Managed Care, Other (non HMO) | Source: Ambulatory Visit | Attending: Radiation Oncology | Admitting: Radiation Oncology

## 2016-03-29 ENCOUNTER — Encounter: Payer: Self-pay | Admitting: Radiation Oncology

## 2016-03-29 VITALS — BP 130/82 | HR 83 | Temp 98.9°F | Resp 18 | Ht <= 58 in | Wt 182.8 lb

## 2016-03-29 DIAGNOSIS — Z510 Encounter for antineoplastic radiation therapy: Secondary | ICD-10-CM | POA: Diagnosis not present

## 2016-03-29 DIAGNOSIS — C50412 Malignant neoplasm of upper-outer quadrant of left female breast: Secondary | ICD-10-CM

## 2016-03-29 NOTE — Progress Notes (Signed)
Department of Radiation Oncology  Phone:  [phone] Fax:        [phone]  Weekly Treatment Note    Name: Brooke Garrison Date: 03/29/2016 MRN: [id number] DOB: 1961-03-24   Diagnosis:     ICD-9-CM ICD-10-CM   1. Breast cancer of upper-outer quadrant of left female breast ([place]) 174.4 C50.412      Current dose: 43.2 Gy  Current fraction: 24   MEDICATIONS: Current Outpatient Prescriptions  Medication Sig Dispense Refill  . acyclovir (ZOVIRAX) 400 MG tablet Take 800 mg by mouth daily.  10  . cetirizine (ZYRTEC) 10 MG tablet Take 10 mg by mouth daily.    Marland Kitchen HYDROcodone-acetaminophen (NORCO/VICODIN) 5-325 MG tablet Take 1 tablet by mouth.    . liothyronine (CYTOMEL) 5 MCG tablet Take 5 mcg by mouth daily.  5  . methotrexate 2.5 MG tablet Take by mouth 3 (three) times a week. Taking 6 tablets once a week    . naproxen (NAPROSYN) 500 MG tablet Take 500 mg by mouth daily.    Marland Kitchen omeprazole (PRILOSEC) 20 MG capsule Take 1 capsule (20 mg total) by mouth daily. 30 capsule 1  . TIROSINT 75 MCG CAPS TAKE ONE TABLET 6 DAYS A WEEK  6  . traMADol (ULTRAM) 50 MG tablet Take 50 mg by mouth daily.    Marland Kitchen ibuprofen (ADVIL,MOTRIN) 200 MG tablet Take 200 mg by mouth every 6 (six) hours as needed.    . ondansetron (ZOFRAN) 8 MG tablet Take 1 tablet (8 mg total) by mouth 2 (two) times daily as needed for refractory nausea / vomiting. Start on day 3 after chemo. (Patient not taking: Reported on 03/29/2016) 30 tablet 1  . oseltamivir (TAMIFLU) 75 MG capsule     . oxyCODONE (OXY IR/ROXICODONE) 5 MG immediate release tablet Take 1 tablet (5 mg total) by mouth every 6 (six) hours as needed for moderate pain, severe pain or breakthrough pain. (Patient not taking: Reported on 03/29/2016) 20 tablet 0  . oxyCODONE (OXY IR/ROXICODONE) 5 MG immediate release tablet Take 1-2 tablets (5-10 mg total) by mouth every 6 (six) hours as needed for moderate pain, severe pain or breakthrough pain. (Patient not taking:  Reported on 03/29/2016) 20 tablet 0  . prochlorperazine (COMPAZINE) 10 MG tablet Take 1 tablet (10 mg total) by mouth every 6 (six) hours as needed (Nausea or vomiting). (Patient not taking: Reported on 03/29/2016) 30 tablet 1  . zolpidem (AMBIEN) 5 MG tablet Take 1 tablet (5 mg total) by mouth at bedtime as needed for sleep. (Patient not taking: Reported on 03/29/2016) 30 tablet 0   No current facility-administered medications for this encounter.      ALLERGIES: Sulfa antibiotics   LABORATORY DATA:  Lab Results  Component Value Date   WBC 12.5 (H) 01/19/2016   HGB 10.5 (L) 01/19/2016   HCT 32.5 (L) 01/19/2016   MCV 97.5 01/19/2016   PLT 253 01/19/2016   Lab Results  Component Value Date   NA 143 01/19/2016   K 3.9 01/19/2016   CO2 28 01/19/2016   Lab Results  Component Value Date   ALT 25 01/19/2016   AST 19 01/19/2016   ALKPHOS 67 01/19/2016   BILITOT 0.51 01/19/2016     NARRATIVE: Brooke Garrison was seen today for weekly treatment management. The chart was checked and the patient's films were reviewed.  Brooke Garrison has received 24 fractions to her left  Breast.  Skin to left breast with redness to the breast and  upper chest,under the axilla with hyperpigmentation skin  intact.  Having mild itching Using Radiaplex gel bid.  Appetite is fair.  Having pain today 5/10 taking Hydrocodone .  Having fatigue most of the day.   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 Wt Readings from Last 3 Encounters:  03/29/16 182 lb 12.8 oz (82.9 kg)  03/20/16 188 lb (85.3 kg)  03/15/16 188 lb 12.8 oz (85.6 kg)  BP 130/82 (BP Location: Right Arm, Patient Position: Sitting, Cuff Size: Normal)   Pulse 83   Temp 98.9 F (37.2 C)   Resp 18   Ht (!) 5" (0.127 m)   Wt 182 lb 12.8 oz (82.9 kg)   LMP 01/08/2006   BMI 5140.90 kg/m   PHYSICAL EXAMINATION: height is 5" (0.127 m) (abnormal) and weight is 182 lb 12.8 oz (82.9 kg). Her temperature is 98.9 F (37.2 C). Her blood pressure is 130/82  and her pulse is 83. Her respiration is 18.      The patient's skin shows increased erythema with some increased hyperpigmentation in the left axilla. No moist desquamation  ASSESSMENT: The patient is doing satisfactorily with treatment.  PLAN: We will continue with the patient's radiation treatment as planned. She will begin her boost treatment next week.

## 2016-03-29 NOTE — Progress Notes (Signed)
Mrs. Babicz has received 24 fractions to her left  Breast.  Skin to left breast with redness to the breast and upper chest,under the axilla with hyperpigmentation skin  intact.  Having mild itching Using Radiaplex gel bid.  Appetite is fair.  Having pain today 5/10 taking Hydrocodone .  Having fatigue most of the day.   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 Wt Readings from Last 3 Encounters:  03/29/16 182 lb 12.8 oz (82.9 kg)  03/20/16 188 lb (85.3 kg)  03/15/16 188 lb 12.8 oz (85.6 kg)  BP 130/82 (BP Location: Right Arm, Patient Position: Sitting, Cuff Size: Normal)   Pulse 83   Temp 98.9 F (37.2 C)   Resp 18   Ht (!) 5" (0.127 m)   Wt 182 lb 12.8 oz (82.9 kg)   LMP 01/08/2006   BMI 5140.90 kg/m

## 2016-04-01 ENCOUNTER — Ambulatory Visit
Admission: RE | Admit: 2016-04-01 | Discharge: 2016-04-01 | Disposition: A | Discharge status: Home / Self Care | Payer: Managed Care, Other (non HMO) | Source: Ambulatory Visit | Attending: Radiation Oncology | Admitting: Radiation Oncology

## 2016-04-01 DIAGNOSIS — Z510 Encounter for antineoplastic radiation therapy: Secondary | ICD-10-CM | POA: Diagnosis not present

## 2016-04-01 NOTE — Progress Notes (Signed)
Asked for Hydrogel pads today skin burning under the axilla pads given with some relief immediately.  Instructions given on how to apply the Hydrogel pad.

## 2016-04-02 ENCOUNTER — Ambulatory Visit
Admission: RE | Admit: 2016-04-02 | Discharge: 2016-04-02 | Disposition: A | Discharge status: Home / Self Care | Payer: Managed Care, Other (non HMO) | Source: Ambulatory Visit | Attending: Radiation Oncology | Admitting: Radiation Oncology

## 2016-04-02 DIAGNOSIS — Z510 Encounter for antineoplastic radiation therapy: Secondary | ICD-10-CM | POA: Diagnosis not present

## 2016-04-03 ENCOUNTER — Ambulatory Visit
Admission: RE | Admit: 2016-04-03 | Discharge: 2016-04-03 | Disposition: A | Discharge status: Home / Self Care | Payer: Managed Care, Other (non HMO) | Source: Ambulatory Visit | Attending: Radiation Oncology | Admitting: Radiation Oncology

## 2016-04-03 DIAGNOSIS — Z510 Encounter for antineoplastic radiation therapy: Secondary | ICD-10-CM | POA: Diagnosis not present

## 2016-04-04 ENCOUNTER — Ambulatory Visit
Admission: RE | Admit: 2016-04-04 | Discharge: 2016-04-04 | Disposition: A | Discharge status: Home / Self Care | Payer: Managed Care, Other (non HMO) | Source: Ambulatory Visit | Attending: Radiation Oncology | Admitting: Radiation Oncology

## 2016-04-04 DIAGNOSIS — Z510 Encounter for antineoplastic radiation therapy: Secondary | ICD-10-CM | POA: Diagnosis not present

## 2016-04-05 ENCOUNTER — Ambulatory Visit
Admission: RE | Admit: 2016-04-05 | Discharge: 2016-04-05 | Disposition: A | Discharge status: Home / Self Care | Payer: Managed Care, Other (non HMO) | Source: Ambulatory Visit | Attending: Radiation Oncology | Admitting: Radiation Oncology

## 2016-04-05 ENCOUNTER — Encounter: Payer: Self-pay | Admitting: Radiation Oncology

## 2016-04-05 VITALS — BP 121/79 | HR 75 | Temp 98.2°F | Ht <= 58 in | Wt 183.5 lb

## 2016-04-05 DIAGNOSIS — Z510 Encounter for antineoplastic radiation therapy: Secondary | ICD-10-CM | POA: Diagnosis not present

## 2016-04-05 DIAGNOSIS — C50412 Malignant neoplasm of upper-outer quadrant of left female breast: Secondary | ICD-10-CM

## 2016-04-05 MED ORDER — SONAFINE EX EMUL
1.0000 "application " | Freq: Once | CUTANEOUS | Status: AC
  Administered 2016-04-05: 1 via TOPICAL

## 2016-04-05 NOTE — Progress Notes (Signed)
Brooke Garrison has completed 29 fractions to her left breast.  She reports having itching of her left breast and soreness under her left arm.  Advised her to try hydrocortisone or benadryl cream for the itching.  She is currently using radiaplex and hydrogel pads.   She has also been given sonafine cream to try.  She reports having fatigue.  The skin on her left breast is red with dermatitis.  BP 121/79 (BP Location: Right Arm, Patient Position: Sitting)   Pulse 75   Temp 98.2 F (36.8 C) (Oral)   Ht (!) 5" (0.127 m)   Wt 183 lb 8 oz (83.2 kg)   LMP 01/08/2006   SpO2 99%   BMI 5160.58 kg/m    Wt Readings from Last 3 Encounters:  04/05/16 183 lb 8 oz (83.2 kg)  03/29/16 182 lb 12.8 oz (82.9 kg)  03/20/16 188 lb (85.3 kg)

## 2016-04-06 NOTE — Progress Notes (Signed)
Department of Radiation Oncology  Phone:  [phone] Fax:        [phone]  Weekly Treatment Note    Name: Brooke Garrison Date: 04/06/2016 MRN: [id number] DOB: 1960-08-04   Diagnosis:     ICD-9-CM ICD-10-CM   1. Breast cancer of upper-outer quadrant of left female breast (HCC) 174.4 C50.412 SONAFINE emulsion 1 application     Current dose: 52.4 Gy  Current fraction: 29   MEDICATIONS: Current Outpatient Prescriptions  Medication Sig Dispense Refill  . acyclovir (ZOVIRAX) 400 MG tablet Take 800 mg by mouth daily.  10  . HYDROcodone-acetaminophen (NORCO/VICODIN) 5-325 MG tablet Take 1 tablet by mouth.    . liothyronine (CYTOMEL) 5 MCG tablet Take 5 mcg by mouth daily.  5  . methotrexate 2.5 MG tablet Take by mouth 3 (three) times a week. Taking 6 tablets once a week    . naproxen (NAPROSYN) 500 MG tablet Take 500 mg by mouth daily.    Marland Kitchen omeprazole (PRILOSEC) 20 MG capsule Take 1 capsule (20 mg total) by mouth daily. 30 capsule 1  . oxyCODONE (OXY IR/ROXICODONE) 5 MG immediate release tablet Take 1 tablet (5 mg total) by mouth every 6 (six) hours as needed for moderate pain, severe pain or breakthrough pain. 20 tablet 0  . TIROSINT 75 MCG CAPS TAKE ONE TABLET 6 DAYS A WEEK  6  . traMADol (ULTRAM) 50 MG tablet Take 50 mg by mouth daily.    . Wound Dressings (SONAFINE) Apply 1 application topically 3 (three) times daily.    . cetirizine (ZYRTEC) 10 MG tablet Take 10 mg by mouth daily.    Marland Kitchen ibuprofen (ADVIL,MOTRIN) 200 MG tablet Take 200 mg by mouth every 6 (six) hours as needed.    . ondansetron (ZOFRAN) 8 MG tablet Take 1 tablet (8 mg total) by mouth 2 (two) times daily as needed for refractory nausea / vomiting. Start on day 3 after chemo. (Patient not taking: Reported on 04/05/2016) 30 tablet 1  . oxyCODONE (OXY IR/ROXICODONE) 5 MG immediate release tablet Take 1-2 tablets (5-10 mg total) by mouth every 6 (six) hours as needed for moderate pain, severe pain or  breakthrough pain. (Patient not taking: Reported on 04/05/2016) 20 tablet 0  . prochlorperazine (COMPAZINE) 10 MG tablet Take 1 tablet (10 mg total) by mouth every 6 (six) hours as needed (Nausea or vomiting). (Patient not taking: Reported on 04/05/2016) 30 tablet 1  . zolpidem (AMBIEN) 5 MG tablet Take 1 tablet (5 mg total) by mouth at bedtime as needed for sleep. (Patient not taking: Reported on 04/05/2016) 30 tablet 0   No current facility-administered medications for this encounter.      ALLERGIES: Sulfa antibiotics   LABORATORY DATA:  Lab Results  Component Value Date   WBC 12.5 (H) 01/19/2016   HGB 10.5 (L) 01/19/2016   HCT 32.5 (L) 01/19/2016   MCV 97.5 01/19/2016   PLT 253 01/19/2016   Lab Results  Component Value Date   NA 143 01/19/2016   K 3.9 01/19/2016   CO2 28 01/19/2016   Lab Results  Component Value Date   ALT 25 01/19/2016   AST 19 01/19/2016   ALKPHOS 67 01/19/2016   BILITOT 0.51 01/19/2016     NARRATIVE: Nobie Putnam Symons was seen today for weekly treatment management. The chart was checked and the patient's films were reviewed.  Maurice Haley has completed 29 fractions to her left breast.  She reports having itching of her left breast and  soreness under her left arm.  Advised her to try hydrocortisone or benadryl cream for the itching.  She is currently using radiaplex and hydrogel pads.   She has also been given sonafine cream to try.  She reports having fatigue.  The skin on her left breast is red with dermatitis.  BP 121/79 (BP Location: Right Arm, Patient Position: Sitting)   Pulse 75   Temp 98.2 F (36.8 C) (Oral)   Ht (!) 5" (0.127 m)   Wt 183 lb 8 oz (83.2 kg)   LMP 01/08/2006   SpO2 99%   BMI 5160.58 kg/m    Wt Readings from Last 3 Encounters:  04/05/16 183 lb 8 oz (83.2 kg)  03/29/16 182 lb 12.8 oz (82.9 kg)  03/20/16 188 lb (85.3 kg)    PHYSICAL EXAMINATION: height is 5" (0.127 m) (abnormal) and weight is 183 lb 8 oz (83.2 kg).  Her oral temperature is 98.2 F (36.8 C). Her blood pressure is 121/79 and her pulse is 75. Her oxygen saturation is 99%.       The patient's skin shows diffuse brighter erythema in the treatment area. No significant desquamation currently.  ASSESSMENT: The patient is doing satisfactorily with treatment.  PLAN: We will continue with the patient's radiation treatment as planned.  We will continue to hold off on an increase in her dose of methotrexate but we discussed that she could make further adjustments after she completes her course of radiation treatment guidance of her rheumatologist.

## 2016-04-08 ENCOUNTER — Ambulatory Visit
Admission: RE | Admit: 2016-04-08 | Discharge: 2016-04-08 | Disposition: A | Discharge status: Home / Self Care | Payer: Managed Care, Other (non HMO) | Source: Ambulatory Visit | Attending: Radiation Oncology | Admitting: Radiation Oncology

## 2016-04-08 DIAGNOSIS — Z510 Encounter for antineoplastic radiation therapy: Secondary | ICD-10-CM | POA: Diagnosis not present

## 2016-04-09 ENCOUNTER — Ambulatory Visit
Admission: RE | Admit: 2016-04-09 | Discharge: 2016-04-09 | Disposition: A | Discharge status: Home / Self Care | Payer: Managed Care, Other (non HMO) | Source: Ambulatory Visit | Attending: Radiation Oncology | Admitting: Radiation Oncology

## 2016-04-09 DIAGNOSIS — Z510 Encounter for antineoplastic radiation therapy: Secondary | ICD-10-CM | POA: Diagnosis not present

## 2016-04-10 ENCOUNTER — Ambulatory Visit
Admission: RE | Admit: 2016-04-10 | Discharge: 2016-04-10 | Disposition: A | Discharge status: Home / Self Care | Payer: Managed Care, Other (non HMO) | Source: Ambulatory Visit | Attending: Radiation Oncology | Admitting: Radiation Oncology

## 2016-04-10 DIAGNOSIS — Z510 Encounter for antineoplastic radiation therapy: Secondary | ICD-10-CM | POA: Diagnosis not present

## 2016-04-11 ENCOUNTER — Ambulatory Visit
Admission: RE | Admit: 2016-04-11 | Discharge: 2016-04-11 | Disposition: A | Discharge status: Home / Self Care | Payer: Managed Care, Other (non HMO) | Source: Ambulatory Visit | Attending: Radiation Oncology | Admitting: Radiation Oncology

## 2016-04-11 ENCOUNTER — Ambulatory Visit: Payer: Managed Care, Other (non HMO)

## 2016-04-11 DIAGNOSIS — Z510 Encounter for antineoplastic radiation therapy: Secondary | ICD-10-CM | POA: Diagnosis not present

## 2016-04-12 ENCOUNTER — Ambulatory Visit
Admission: RE | Admit: 2016-04-12 | Discharge: 2016-04-12 | Disposition: A | Discharge status: Home / Self Care | Payer: Managed Care, Other (non HMO) | Source: Ambulatory Visit | Attending: Radiation Oncology | Admitting: Radiation Oncology

## 2016-04-12 ENCOUNTER — Ambulatory Visit (HOSPITAL_BASED_OUTPATIENT_CLINIC_OR_DEPARTMENT_OTHER): Payer: Managed Care, Other (non HMO) | Admitting: Hematology

## 2016-04-12 ENCOUNTER — Other Ambulatory Visit (HOSPITAL_BASED_OUTPATIENT_CLINIC_OR_DEPARTMENT_OTHER): Payer: Managed Care, Other (non HMO)

## 2016-04-12 ENCOUNTER — Encounter: Payer: Self-pay | Admitting: Radiation Oncology

## 2016-04-12 VITALS — BP 121/76 | HR 71 | Temp 98.3°F | Resp 20 | Ht 60.0 in | Wt 186.0 lb

## 2016-04-12 VITALS — BP 143/77 | HR 76 | Temp 98.1°F | Resp 18 | Wt 186.5 lb

## 2016-04-12 DIAGNOSIS — M069 Rheumatoid arthritis, unspecified: Secondary | ICD-10-CM | POA: Diagnosis not present

## 2016-04-12 DIAGNOSIS — Z510 Encounter for antineoplastic radiation therapy: Secondary | ICD-10-CM | POA: Diagnosis not present

## 2016-04-12 DIAGNOSIS — E039 Hypothyroidism, unspecified: Secondary | ICD-10-CM | POA: Diagnosis not present

## 2016-04-12 DIAGNOSIS — C50412 Malignant neoplasm of upper-outer quadrant of left female breast: Secondary | ICD-10-CM

## 2016-04-12 DIAGNOSIS — C50012 Malignant neoplasm of nipple and areola, left female breast: Secondary | ICD-10-CM

## 2016-04-12 DIAGNOSIS — F329 Major depressive disorder, single episode, unspecified: Secondary | ICD-10-CM

## 2016-04-12 DIAGNOSIS — C50011 Malignant neoplasm of nipple and areola, right female breast: Secondary | ICD-10-CM

## 2016-04-12 LAB — COMPREHENSIVE METABOLIC PANEL WITH GFR
ALT: 13 U/L (ref 0–55)
AST: 14 U/L (ref 5–34)
Albumin: 3.5 g/dL (ref 3.5–5.0)
Alkaline Phosphatase: 96 U/L (ref 40–150)
Anion Gap: 10 meq/L (ref 3–11)
BUN: 27.3 mg/dL — ABNORMAL HIGH (ref 7.0–26.0)
CO2: 25 meq/L (ref 22–29)
Calcium: 9.3 mg/dL (ref 8.4–10.4)
Chloride: 108 meq/L (ref 98–109)
Creatinine: 0.8 mg/dL (ref 0.6–1.1)
EGFR: 80 ml/min/1.73 m2 — ABNORMAL LOW
Glucose: 110 mg/dL (ref 70–140)
Potassium: 4 meq/L (ref 3.5–5.1)
Sodium: 142 meq/L (ref 136–145)
Total Bilirubin: <0.3 mg/dL (ref 0.20–1.20)
Total Protein: 7 g/dL (ref 6.4–8.3)

## 2016-04-12 LAB — CBC WITH DIFFERENTIAL/PLATELET
BASO%: 0.4 % (ref 0.0–2.0)
BASOS ABS: 0 10*3/uL (ref 0.0–0.1)
EOS ABS: 0.2 10*3/uL (ref 0.0–0.5)
EOS%: 3.2 % (ref 0.0–7.0)
HEMATOCRIT: 33.6 % — AB (ref 34.8–46.6)
HEMOGLOBIN: 11 g/dL — AB (ref 11.6–15.9)
LYMPH#: 1.4 10*3/uL (ref 0.9–3.3)
LYMPH%: 19.9 % (ref 14.0–49.7)
MCH: 28.6 pg (ref 25.1–34.0)
MCHC: 32.7 g/dL (ref 31.5–36.0)
MCV: 87.5 fL (ref 79.5–101.0)
MONO#: 0.7 10*3/uL (ref 0.1–0.9)
MONO%: 10.5 % (ref 0.0–14.0)
NEUT#: 4.6 10*3/uL (ref 1.5–6.5)
NEUT%: 66 % (ref 38.4–76.8)
Platelets: 285 10*3/uL (ref 145–400)
RBC: 3.84 10*6/uL (ref 3.70–5.45)
RDW: 16 % — AB (ref 11.2–14.5)
WBC: 7 10*3/uL (ref 3.9–10.3)

## 2016-04-12 NOTE — Progress Notes (Addendum)
[place] [facility]  Telephone:([phone] Fax:([phone]  Clinic Follow Up Note   Patient Care Team: Larence Penning, MD as PCP - General (Internal Medicine) Deliah Goody (Specialist) Janyth Pupa, DO as Consulting Physician (Obstetrics and Gynecology) Rolm Bookbinder, MD as Consulting Physician (General Surgery) Thea Silversmith, MD as Consulting Physician (Radiation Oncology) 04/12/2016  CHIEF COMPLAINTS:  Follow up left breast cancer  Oncology History   Breast cancer of upper-outer quadrant of left female breast [facility])   Staging form: Breast, AJCC 7th Edition     Clinical stage from 09/28/2015: Stage IIA (T2, N0, M0) - Signed by Truitt Merle, MD on 10/03/2015       Breast cancer of upper-outer quadrant of left female breast ([place])   09/27/2015 Initial Diagnosis    Breast cancer of upper-outer quadrant of left female breast ([place])      09/27/2015 Initial Biopsy    Left breast core needle biopsy of the mass at the 3:00 position showed invasive and in situ ductal carcinoma, lymphovascular invasion is identified.      09/27/2015 Receptors her2    ER 95% positive, PR negative, HER-2 negative, Ki-67 60%      09/27/2015 Mammogram    diagnostic mammogram and of her left breast showa 2.3 cm mass in the 3:00 position retroareolar left breast      09/27/2015 Oncotype testing    RS: 55 high risk, the group average risk of distant recurrence for patient's wishes recurrence score>50 was 34%.      10/19/2015 Imaging    Breast MRI with and without contrast showed known 3.5cm left breast malignancy at 3:00 on a background of innumerable enhancing bilateral foci. Additional suspicious mass 19 x 11 x 13 mm at 2:00 position of left breast      11/03/2015 Pathology Results    MRI guided second left breast biopsy showed invasive ductal carcinoma, ER 100% positive, PR negative, HER-2 negative Ki-67 5%      02/26/2016 -  Radiation Therapy    Adjuvant breast and axilla radiation         HISTORY OF PRESENTING ILLNESS:  Brooke Garrison 55 y.o. female is here because of er newly diagnosed left She is accompanied by her daughter-in-law her to our multidisciplinary breast clinic today.  She was found to have a palpable mass in her left breast by her GYN during her routine visit, no tenderness, skin change or nipple discharge. She denies any new syndroms, and feels well overall. she had mammogram one year ago which was normal, she had left breast biopsy which was benigh 20 years ago.   She ahs RA, on MTX and humaria for 4-5 years, she had significant multiple joint pain when she was diagnosed 6-7 years ago, and improved sighnitantly with treatment, she has mild joint pain, which get worse with physical exertion. She is married, liveswith her husband in [place], and works in [place]. Her husband had ruptured appendix about 6 weeks ago,is still recovering from surgery.   GYN HISTORY  Menarchal: 10 LMP: 2009 (hysrectomy)  Contraceptive:25 years  HRT: no  G2P2: she has 64 and 58 yo sons   CURRENT THERAPY:  Adjuvant irradiation  INTERIM HISTORY:  Pam returns for follow up. She started adjuvant breast and axilla radiation on 02/26/2016. She has been tolerating well overall, does have moderate radiation dermatitis, and mild fatigue, no other complaints. She will finish on coming Monday. Her rheumatoid arthritis was well controlled when she was on chemotherapy, but if  it up afterwards, she is taking methotrexate low dose, has moderate to severe arthralgia, especially on her ankles and knees, she has moderate difficulty walking. She is depressed lately due to her arthralgia and the coming adjuvant chemotherapy.  MEDICAL HISTORY:  Past Medical History:  Diagnosis Date  . Arthritis    RA-stopped methotrexate and humira  . Breast cancer ([place])   . Breast cancer of upper-outer quadrant of left female breast ([place]) 09/29/2015  . GERD (gastroesophageal reflux disease)    thruout  chemo  . Hypothyroidism   . Thyroid disease     SURGICAL HISTORY: Past Surgical History:  Procedure Laterality Date  . ABDOMINAL HYSTERECTOMY     partial  . RADIOACTIVE SEED GUIDED MASTECTOMY WITH AXILLARY SENTINEL LYMPH NODE BIOPSY Left 01/17/2016   Procedure: DOUBLE RADIOACTIVE SEED GUIDED LEFT BREAST LUMPECTOMY AND LEFT AXILLARY SENTINEL LYMPH NODE BIOPSY ;  Surgeon: Rolm Bookbinder, MD;  Location: [facility];  Service: General;  Laterality: Left;  . RE-EXCISION OF BREAST LUMPECTOMY Left 01/23/2016   Procedure: RE-EXCISION LEFT BREAST LUMPECTOMY;  Surgeon: Rolm Bookbinder, MD;  Location: [facility];  Service: General;  Laterality: Left;    SOCIAL HISTORY: Social History   Social History  . Marital status: Married    Spouse name: N/A  . Number of children: 2  . Years of education: N/A   Occupational History  . Production manager    Social History Main Topics  . Smoking status: Never Smoker  . Smokeless tobacco: Never Used  . Alcohol use Yes     Comment: social  . Drug use: No  . Sexual activity: Not on file   Other Topics Concern  . Not on file   Social History Narrative  . No narrative on file    FAMILY HISTORY: Family History  Problem Relation Age of Onset  . Hodgkin's lymphoma Mother   . Melanoma Maternal Grandfather     ALLERGIES:  is allergic to sulfa antibiotics.  MEDICATIONS:  Current Outpatient Prescriptions  Medication Sig Dispense Refill  . acyclovir (ZOVIRAX) 400 MG tablet Take 800 mg by mouth daily.  10  . cetirizine (ZYRTEC) 10 MG tablet Take 10 mg by mouth daily.    Marland Kitchen HYDROcodone-acetaminophen (NORCO/VICODIN) 5-325 MG tablet Take 1 tablet by mouth.    Marland Kitchen ibuprofen (ADVIL,MOTRIN) 200 MG tablet Take 200 mg by mouth every 6 (six) hours as needed.    Marland Kitchen liothyronine (CYTOMEL) 5 MCG tablet Take 5 mcg by mouth daily.  5  . methotrexate 2.5 MG tablet Take by mouth 3 (three) times a week. Taking 6 tablets once a week      . naproxen (NAPROSYN) 500 MG tablet Take 500 mg by mouth daily.    Marland Kitchen omeprazole (PRILOSEC) 20 MG capsule Take 1 capsule (20 mg total) by mouth daily. 30 capsule 1  . ondansetron (ZOFRAN) 8 MG tablet Take 1 tablet (8 mg total) by mouth 2 (two) times daily as needed for refractory nausea / vomiting. Start on day 3 after chemo. (Patient not taking: Reported on 04/05/2016) 30 tablet 1  . oxyCODONE (OXY IR/ROXICODONE) 5 MG immediate release tablet Take 1 tablet (5 mg total) by mouth every 6 (six) hours as needed for moderate pain, severe pain or breakthrough pain. 20 tablet 0  . oxyCODONE (OXY IR/ROXICODONE) 5 MG immediate release tablet Take 1-2 tablets (5-10 mg total) by mouth every 6 (six) hours as needed for moderate pain, severe pain or breakthrough pain. (Patient not taking: Reported  on 04/05/2016) 20 tablet 0  . prochlorperazine (COMPAZINE) 10 MG tablet Take 1 tablet (10 mg total) by mouth every 6 (six) hours as needed (Nausea or vomiting). (Patient not taking: Reported on 04/05/2016) 30 tablet 1  . TIROSINT 75 MCG CAPS TAKE ONE TABLET 6 DAYS A WEEK  6  . traMADol (ULTRAM) 50 MG tablet Take 50 mg by mouth daily.    . Wound Dressings (SONAFINE) Apply 1 application topically 3 (three) times daily.    Marland Kitchen zolpidem (AMBIEN) 5 MG tablet Take 1 tablet (5 mg total) by mouth at bedtime as needed for sleep. (Patient not taking: Reported on 04/05/2016) 30 tablet 0   No current facility-administered medications for this visit.     REVIEW OF SYSTEMS:   Constitutional: Denies fevers, chills or abnormal night sweats Eyes: Denies blurriness of vision, double vision or watery eyes Ears, nose, mouth, throat, and face: Denies mucositis or sore throat Respiratory: Denies cough, dyspnea or wheezes Cardiovascular: Denies palpitation, chest discomfort or lower extremity swelling Gastrointestinal:  Denies nausea, heartburn or change in bowel habits Skin: Denies abnormal skin rashes Lymphatics: Denies new  lymphadenopathy or easy bruising Neurological:Denies numbness, tingling or new weaknesses Behavioral/Psych: Mood is stable, no new changes  All other systems were reviewed with the patient and are negative.  PHYSICAL EXAMINATION: ECOG PERFORMANCE STATUS: 1 - Symptomatic but completely ambulatory  Vitals:   04/12/16 1529  BP: 121/76  Pulse: 71  Resp: 20  Temp: 98.3 F (36.8 C)   Filed Weights   04/12/16 1529  Weight: 186 lb (84.4 kg)    GENERAL:alert, no distress and comfortable SKIN: skin color, texture, turgor are normal, no rashes or significant lesions EYES: normal, conjunctiva are pink and non-injected, sclera clear OROPHARYNX:no exudate, no erythema and lips, buccal mucosa, and tongue normal  NECK: supple, thyroid normal size, non-tender, without nodularity LYMPH:  no palpable lymphadenopathy in the cervical, axillary or inguinal LUNGS: clear to auscultation and percussion with normal breathing effort HEART: regular rate & rhythm and no murmurs and no lower extremity edema ABDOMEN:abdomen soft, non-tender and normal bowel sounds Musculoskeletal:no cyanosis of digits and no clubbing  PSYCH: alert & oriented x 3 with fluent speech NEURO: no focal motor/sensory deficits Breasts: Breast inspection showed them to be symmetrical with no nipple discharge. Surgical incision in the left breast is well healed. Diffuse skin erythema and pigmentation of the left breast, no significant blister or ulcers.  LABORATORY DATA:  I have reviewed the data as listed CBC Latest Ref Rng & Units 04/12/2016 01/19/2016 12/22/2015  WBC 3.9 - 10.3 10e3/uL 7.0 12.5(H) 15.6(H)  Hemoglobin 11.6 - 15.9 g/dL 11.0(L) 10.5(L) 10.6(L)  Hematocrit 34.8 - 46.6 % 33.6(L) 32.5(L) 31.8(L)  Platelets 145 - 400 10e3/uL 285 253 294    CMP Latest Ref Rng & Units 04/12/2016 01/19/2016 12/22/2015  Glucose 70 - 140 mg/dl 110 89 100  BUN 7.0 - 26.0 mg/dL 27.3(H) 20.5 25.1  Creatinine 0.6 - 1.1 mg/dL 0.8 0.8 0.8  Sodium 136  - 145 mEq/L 142 143 140  Potassium 3.5 - 5.1 mEq/L 4.0 3.9 4.1  CO2 22 - 29 mEq/L 25 28 25   Calcium 8.4 - 10.4 mg/dL 9.3 9.2 9.8  Total Protein 6.4 - 8.3 g/dL 7.0 6.4 6.8  Total Bilirubin 0.20 - 1.20 mg/dL <0.30 0.51 0.42  Alkaline Phos 40 - 150 U/L 96 67 71  AST 5 - 34 U/L 14 19 18   ALT 0 - 55 U/L 13 25 26  PATHOLOGY REPORT   Diagnosis 09/27/2015 Breast, left, needle core biopsy, 3:00 o'clock, retroareolar - INVASIVE AND IN SITU MAMMARY CARCINOMA. - LYMPHOVASCULAR INVASION IS IDENTIFIED. - SEE COMMENT. Microscopic Comment The carcinoma appears grade 2. An E-cadherin stain will be performed and the results reported separately. A breast prognostic profile will be performed and the results reported separately. The results were called to [facility] on 09/28/2015. (JBK:ecj 09/28/2015) The malignant cells are positive for E-Cadherin, supporting a ductal phenotype.  Results: IMMUNOHISTOCHEMICAL AND MORPHOMETRIC ANALYSIS PERFORMED MANUALLY Estrogen Receptor: 95%, POSITIVE, STRONG STAINING INTENSITY Progesterone Receptor: 0%, NEGATIVE Proliferation Marker Ki67: 60% Results: HER2 - NEGATIVE RATIO OF HER2/CEP17 SIGNALS 1.04 AVERAGE HER2 COPY NUMBER PER CELL 1.20  Oncotype DX RS: 55 high risk, the group average risk of distant recurrence for patient's wishes recurrence score>50 was 34%.  Diagnosis 11/03/2015 Breast, left, needle core biopsy, upper outer quadrant - INVASIVE MAMMARY CARCINOMA. - MAMMARY CARCINOMA IN SITU. Microscopic Comment The findings are consistent with grade 2/3 invasive carcinoma and most likely represent invasive ductal as confirmed on the previous core biopsies ([id number]). E. cadherin and breast prognostic profile will be performed. Dr Lyndon Code agrees. Called to [facility] on 11/06/2015. (JDP:ecj 11/06/2015) Addendum: Immunohistochemistry with E. cadherin shows diffuse strong positivity consistent with ductal  carcinoma. (JDP:ecj 11/06/2015)  PROGNOSTIC INDICATORS Results: IMMUNOHISTOCHEMICAL AND MORPHOMETRIC ANALYSIS PERFORMED MANUALLY Estrogen Receptor: 100%, POSITIVE, STRONG STAINING INTENSITY Progesterone Receptor: 0%, NEGATIVE Proliferation Marker Ki67: 5% Results: HER2 - NEGATIVE RATIO OF HER2/CEP17 SIGNALS 1.19 AVERAGE HER2 COPY NUMBER PER CELL 1.85   Diagnosis 01/17/2016 1. Breast, lumpectomy, Left - INVASIVE DUCTAL CARCINOMA, 2.1 CM - PREVIOUS BIOPSY SITES. - INVASIVE CARCINOMA BROADLY INVOLVES THE MEDIAL MARGIN AND IS LESS THAN 0.1 CM FROM THE LATERAL MARGIN AND IS 0.1 CM FROM THE INFERIOR MARGIN. - LYMPHOVASCULAR INVOLVEMENT BY TUMOR. 2. Lymph node, sentinel, biopsy, Left axillary - ONE BENIGN LYMPH NODE (0/1). 3. Lymph node, sentinel, biopsy, Left axillary - ONE BENIGN LYMPH NODE (0/1). 4. Lymph node, sentinel, biopsy, Left axillary - ONE BENIGN LYMPH NODE (0/1). 5. Lymph node, sentinel, biopsy, Left axillary - METASTATIC CARCINOMA IN ONE LYMPH NODE (1/1). Microscopic Comment 1. BREAST, INVASIVE TUMOR, WITH LYMPH NODES PRESENT Specimen, including laterality and lymph node sampling (sentinel, non-sentinel): Left breast and four sentinel lymph nodes. Procedure: Localized lumpectomy and four sentinel lymph node biopsies. Histologic type: Ductal. Grade: 2 Tubule formation: 3 Nuclear pleomorphism: 2 Mitotic:1 Tumor size (gross measurement): 2.1 cm Margins: Focally involved by tumor. Invasive, distance to closest margin: Broadly involves medial margin. In-situ, distance to closest margin: N/A If margin positive, focally or broadly: Broadly, medial margin. 1 of 3 FINAL for DESHANNON, SEIDE ([id number]) Microscopic Comment(continued) Lymphovascular invasion: Present. Ductal carcinoma in situ: No Grade: N/A Extensive intraductal component: No Lobular neoplasia: No Tumor focality: See comment. Treatment effect: Present. If present, treatment effect in breast tissue,  lymph nodes or both: Breast tissue. Extent of tumor: Skin: Free of tumor. Nipple: N/A Skeletal muscle: N/A Lymph nodes: Examined: 4 Sentinel 0 Non-sentinel 4 Total Lymph nodes with metastasis: 1 Isolated tumor cells (< 0.2 mm): 0 Micrometastasis: (> 0.2 mm and < 2.0 mm): 0 Macrometastasis: (> 2.0 mm): 1 Extracapsular extension: No Breast prognostic profile: Case number [id number] Estrogen receptor: Positive both lesions, 100% and 95% Progesterone receptor: Negative both lesions. Her 2 neu: Negative both lesions, ratio 1.19 and 1.04. Will be repeated on current residual tumor. Ki-67: 5% and 60% Non-neoplastic breast: Fibrocystic changes. TNM: ypT2, ypN1a,  ypMX Comments: There are two separate areas of localization and one contains a residual 2.1 cm invasive ductal carcinoma. The other area of localization contains previous biopsy site with fibrosis, inflammation and hemosiderin consistent with treatment effect with no residual carcinoma identified. The residual carcinoma broadly involves the medial margin and is  RADIOGRAPHIC STUDIES: I have personally reviewed the radiological images as listed and agreed with the findings in the report.  09/28/2015  CLINICAL DATA:  2.3 cm mass in the 3 o'clock retroareolar region of the left breast with imaging findings suspicious for malignancy. EXAM: ULTRASOUND GUIDED LEFT BREAST CORE NEEDLE BIOPSY COMPARISON:  Previous exam(s). FINDINGS: I met with the patient and we discussed the procedure of ultrasound-guided biopsy, including benefits and alternatives. We discussed the high likelihood of a successful procedure. We discussed the risks of the procedure, including infection, bleeding, tissue injury, clip migration, and inadequate sampling. Informed written consent was given. The usual time-out protocol was performed immediately prior to the procedure. Using sterile technique and 1% Lidocaine as local anesthetic, under direct ultrasound visualization, a  12 gauge spring-loaded device was used to perform biopsy of the recently demonstrated 2.3 cm mass in the 3 o'clock retroareolar region of the left breast using a caudal approach. At the conclusion of the procedure a ribbon shaped tissue marker clip was deployed into the biopsy cavity. Follow up 2 view mammogram was performed and dictated separately. IMPRESSION: Ultrasound guided biopsy of a 2.3 cm mass in the 3 o'clock retroareolar left breast. No apparent complications. Electronically Signed: By: Claudie Revering M.D. On: 09/27/2015 11:01   Breast MRI w wo contrast 01/17/2016 FINDINGS: Status post excision of the left breast. The 2 radioactive seeds and biopsy marker clips are present, completely intact, and were marked for pathology. The positions of each seed and each biopsy marker clip within the specimen was discussed with the OR staff during the Procedure.    IMPRESSION: Specimen radiograph of the left breast.   ASSESSMENT & PLAN: 55 year old caucasian female, with past medical history of rheumatoid arthritis, on  And hypothyroidism, presented with palpable left breast mass.  1. Breast cancer of upper-outer quadrant of left female breast, multi-focal (2) cT2N0M0, stage IIA, ER+/PR-/HER2+- Ki67 60% and 5%, ypT2N1aM0, stage IIB -I discussed her surgical pathology findings with patient and her husband in great details -She unfortunately has positive margins, and required surgical excision -Unfortunately she has one sentinel lymph node positive on the surgical sample, even after neoadjuvant chemotherapy. -She certainly has high risk for cancer recurrence, giving the node-positive disease after neoadjuvant chemotherapy -She is completing adjuvant breast and axillary radiation -Due to the high-risk of recurrence, I recommend her to consider adjuvant chemotherapy with oral xeloda, the potential benefit and side effects were discussed with her, the alternative options of adjuvant antiestrogen alone  and observation was also discussed. The patient struggled about her decision on chemotherapy, but finally agreed to try. --Chemotherapy consent: Side effects including but does not not limited to, fatigue, nausea, vomiting, diarrhea, hair loss, neuropathy, fluid retention, renal and kidney dysfunction, neutropenic fever, needed for blood transfusion, bleeding, were discussed with patient in great detail. She agrees to proceed. -I will start her on antiestrogen therapy after she completes adjuvant chemotherapy. She had a hysterectomy, we'll check her [place] to see if she is post menopause.  2. Rheumatology arthritis -She'll continue to follow-up with her rheumatologist -I previously spoke with her rhumatologist Dr. Billey Chang today and he agrees to hold methotrexateand (until complete adjuvant chemo) and Humira (  at least for 5 years) -She has worsening arthralgia lately due to inadequate treatment for her rheumatoid arthritis when she is on radiation -We discussed that chemotherapy may help to control her rheumatoid arthritis  3. hypothyroidism -She will continue Synthroid and follow up with PCP   4. Depression -She appears to be depressed due to the arthralgia from rheumatoid arthritis, and her needs for adjuvant chemotherapy -But she denies depression. We offered her SW counseling, she declined. I encouraged her to be positive  Plan -she will start adjuvant Xeloda in 2 weeks  -I will see her back in 4-5 weeks   All questions were answered. The patient knows to call the clinic with any problems, questions or concerns. I spent 30  minutes counseling the patient face to face. The total time spent in the appointment was 35 minutes and more than 50% was on counseling.     Truitt Merle, MD 04/12/2016

## 2016-04-12 NOTE — Progress Notes (Signed)
Weekly rad txs left breast 34/35 completed,  Will see Dr. Burr Medico 1st then come back to be seen by Dr. Lisbeth Renshaw, arrived late today, patient has completed 34/35  , peeling under lft axilla, raw looking,  erythme aon breast,nipple area dry, flaky, using radiaplex bid, gave 1 month f/u appt with Shona Simpson, PA 06/04/16, pATUENT TEARFUL , SAW dR. fENG AND SHE WILLSTART xELODA IN 2 WEEKS, , SHE FELT SHE JUST WANTED TO BE DONE WITH ALL OF THIS, GAVE ENCOURAGEMENT, LISTENED,  4:45 PM BP (!) 143/77 (BP Location: Right Arm, Patient Position: Sitting, Cuff Size: Normal)   Pulse 76   Temp 98.1 F (36.7 C) (Oral)   Resp 18   Wt 186 lb 8 oz (84.6 kg)   LMP 01/08/2006   BMI 36.42 kg/m   Wt Readings from Last 3 Encounters:  04/12/16 186 lb (84.4 kg)  04/12/16 186 lb 8 oz (84.6 kg)  04/05/16 183 lb 8 oz (83.2 kg)

## 2016-04-14 ENCOUNTER — Encounter: Payer: Self-pay | Admitting: Hematology

## 2016-04-14 NOTE — Progress Notes (Signed)
Department of Radiation Oncology  Phone:  [phone] Fax:        [phone]  Weekly Treatment Note    Name: Brooke Garrison Date: 04/14/2016 MRN: [id number] DOB: [birth year]   Diagnosis:     ICD-9-CM ICD-10-CM   1. Breast cancer of upper-outer quadrant of left female breast ([place]) 174.4 C50.412      Current dose: 62.4 Gy  Current fraction: 34   MEDICATIONS: Current Outpatient Prescriptions  Medication Sig Dispense Refill  . acyclovir (ZOVIRAX) 400 MG tablet Take 800 mg by mouth daily.  10  . cetirizine (ZYRTEC) 10 MG tablet Take 10 mg by mouth daily.    Marland Kitchen liothyronine (CYTOMEL) 5 MCG tablet Take 5 mcg by mouth daily.  5  . methotrexate 2.5 MG tablet Take by mouth 3 (three) times a week. Taking 6 tablets once a week    . naproxen (NAPROSYN) 500 MG tablet Take 500 mg by mouth daily.    Marland Kitchen omeprazole (PRILOSEC) 20 MG capsule Take 1 capsule (20 mg total) by mouth daily. 30 capsule 1  . oxyCODONE (OXY IR/ROXICODONE) 5 MG immediate release tablet Take 1 tablet (5 mg total) by mouth every 6 (six) hours as needed for moderate pain, severe pain or breakthrough pain. 20 tablet 0  . TIROSINT 75 MCG CAPS TAKE ONE TABLET 6 DAYS A WEEK  6  . traMADol (ULTRAM) 50 MG tablet Take 50 mg by mouth daily.    . Wound Dressings (SONAFINE) Apply 1 application topically 3 (three) times daily.     No current facility-administered medications for this encounter.      ALLERGIES: Sulfa antibiotics   LABORATORY DATA:  Lab Results  Component Value Date   WBC 7.0 04/12/2016   HGB 11.0 (L) 04/12/2016   HCT 33.6 (L) 04/12/2016   MCV 87.5 04/12/2016   PLT 285 04/12/2016   Lab Results  Component Value Date   NA 142 04/12/2016   K 4.0 04/12/2016   CO2 25 04/12/2016   Lab Results  Component Value Date   ALT 13 04/12/2016   AST 14 04/12/2016   ALKPHOS 96 04/12/2016   BILITOT <0.30 04/12/2016     NARRATIVE: Brooke Garrison was seen today for weekly treatment management. The  chart was checked and the patient's films were reviewed.  Weekly rad txs left breast 34/35 completed,  Will see Brooke Garrison 1st then come back to be seen by Brooke Garrison, arrived late today, patient has completed 34/35  , peeling under lft axilla, raw looking,  erythme aon breast,nipple area dry, flaky, using radiaplex bid, gave 1 month f/u appt with Brooke Simpson, PA 06/04/16, pATUENT TEARFUL , SAW Brooke Garrison AND SHE WILLSTART xELODA IN 2 WEEKS, , SHE FELT SHE JUST WANTED TO BE DONE WITH ALL OF THIS, GAVE ENCOURAGEMENT, LISTENED,  9:00 PM BP (!) 143/77 (BP Location: Right Arm, Patient Position: Sitting, Cuff Size: Normal)   Pulse 76   Temp 98.1 F (36.7 C) (Oral)   Resp 18   Wt 186 lb 8 oz (84.6 kg)   LMP 01/08/2006   BMI 36.42 kg/m   Wt Readings from Last 3 Encounters:  04/12/16 186 lb (84.4 kg)  04/12/16 186 lb 8 oz (84.6 kg)  04/05/16 183 lb 8 oz (83.2 kg)    PHYSICAL EXAMINATION: weight is 186 lb 8 oz (84.6 kg). Her oral temperature is 98.1 F (36.7 C). Her blood pressure is 143/77 (abnormal) and her pulse is 76. Her respiration is 18.  Diffuse erythema present in the treatment area. No moist desquamation. Overall the patient's skin looks satisfactorily for nearing completion of her treatment, especially with the patient on methotrexate. Her skin has held off well overall given the specifics of her case.  ASSESSMENT: The patient is doing satisfactorily with treatment.  PLAN: We will continue with the patient's radiation treatment as planned.

## 2016-04-15 ENCOUNTER — Ambulatory Visit: Payer: Managed Care, Other (non HMO)

## 2016-04-15 ENCOUNTER — Encounter: Payer: Self-pay | Admitting: Pharmacist

## 2016-04-15 ENCOUNTER — Ambulatory Visit: Payer: Managed Care, Other (non HMO) | Admitting: Hematology

## 2016-04-15 ENCOUNTER — Other Ambulatory Visit: Payer: Self-pay | Admitting: *Deleted

## 2016-04-15 DIAGNOSIS — C50412 Malignant neoplasm of upper-outer quadrant of left female breast: Secondary | ICD-10-CM

## 2016-04-15 DIAGNOSIS — Z170 Estrogen receptor positive status [ER+]: Principal | ICD-10-CM

## 2016-04-15 MED ORDER — CAPECITABINE 500 MG PO TABS: 1000.0000 mg/m2 | ORAL_TABLET | Freq: Two times a day (BID) | ORAL | 1 refills | Status: AC

## 2016-04-15 NOTE — Addendum Note (Signed)
Addended by: Truitt Merle on: 04/15/2016 09:15 AM   Modules accepted: Orders

## 2016-04-15 NOTE — Progress Notes (Signed)
Oral Chemotherapy Pharmacist Encounter  New prescription for Xeloda has been sent to the [facility] outpatient pharmacy for benefit analysis and approval.   Will follow up with patient regarding insurance and pharmacy.  Once pt starts Xeloda, we will follow up in 1-2 weeks for adherence and toxicity management.   Thank you, Kennith Center, Pharm.D., CPP 04/15/2016@2 :05 PM Oral Chemotherapy Clinic

## 2016-04-16 ENCOUNTER — Telehealth: Payer: Self-pay | Admitting: Pharmacist

## 2016-04-16 ENCOUNTER — Telehealth: Payer: Self-pay | Admitting: *Deleted

## 2016-04-16 NOTE — Telephone Encounter (Signed)
Received call from pt stating that she saw Dr Burr Medico last fri & she wanted to start her on a chemo pill.  Her insurance has changed.  It is [employer].  Health plan ID [id number] & member ID is [id number] & group # is [id number].  She would like someone to call her with the cost so she can make a decision on taking med.   Message to Dr Feng/Pod RN/Jessie Mack-Pharmacist.

## 2016-04-16 NOTE — Telephone Encounter (Signed)
Jesse,  I rounded her xeloda dose to 2000mg  bid. If she has tolerance issue with this dose, I will certainly reduce as needed.   Thanks,  Truitt Merle MD

## 2016-04-16 NOTE — Telephone Encounter (Signed)
Brooke Garrison, did you get her Xeloda prescription yesterday? Please see her updated insurance info. Please let her know as soon as you have her co-pay issues. She is a very anxious lady, if the process takes more than 2-3 days, could you let her know that you are working on it?   Thanks   Truitt Merle MD

## 2016-04-16 NOTE — Telephone Encounter (Signed)
Oral Chemotherapy Pharmacist Encounter   I spoke with patient for overview of new oral chemotherapy medication: Xeloda.    The prescriptions have been sent to the [facility] for benefit analysis. New prescription insurance information was needed as insurance on file with [facility] is no longer valid. I inquired about this information while on the phone.  [employer] ID: [id number] Rx Bin: [id number] RxPCN: [id number] RxGrp: [facility]  Counseled patient on administration, dosing, side effects, safe handling, and monitoring. Per prescription, patient is to take 2000mg  (4 tabs) in the am and in the pm days 1-14, then off 7 days, to be repeated Q 3 weeks. Per patient, Dr. Burr Medico stated 4 tabs in the am and 3 tabs in the pm, pt will contact Dr. Burr Medico for clarification about dosing.  Side effects include but not limited to: N/V/D, fatigue, abnormal labs, and hand-foot syndrome.  Patient voiced understanding and appreciation.   All questions answered.  Will follow up with patient regarding insurance and pharmacy.   Thank you,  Johny Drilling, PharmD, BCPS 04/16/2016  3:33 PM Oral Chemotherapy Clinic [phone]

## 2016-04-16 NOTE — Telephone Encounter (Signed)
Dr. Burr Medico, Johny Drilling is speaking to pt right now! :) Collins Dimaria

## 2016-04-17 NOTE — Telephone Encounter (Signed)
Xeloda prescription -- UPDATE  Per patient's insurance ([employer]), Xel[facility] must [facility]. Prescription referral form, patient insurance info, and last MD note will be faxed to [facility].  Oral Chemo Clinic will continue to follow.  Johny Drilling, PharmD, BCPS Pharmacy: [phone] Oral Chemo Clinic: [phone] 04/17/2016 3:38 PM

## 2016-04-24 ENCOUNTER — Encounter: Payer: Self-pay | Admitting: Radiation Oncology

## 2016-04-24 ENCOUNTER — Telehealth: Payer: Self-pay | Admitting: Pharmacist

## 2016-04-24 NOTE — Telephone Encounter (Signed)
Called [facility] to f/u on status of Xeloda rx. [facility] shows Xeloda was delivered on 04/23/16. Copay $0.  Will f/u with patient in ~ 1 week for compliance and side effect/toxicity management.   Raul Del, PharmD, BCPS, BCOP Oral oncology clinic [phone]

## 2016-04-24 NOTE — Progress Notes (Signed)
°  Radiation Oncology         [phone] ________________________________  Name: Brooke Garrison MRN: [id number]  Date: 04/24/2016  DOB: 09/14/1960  End of Treatment Note  Diagnosis:  Breast cancer of upper-outer quadrant of left breast    Indication for treatment:  Curative       Radiation treatment dates:   02/26/16-04/12/16  Site/dose:   1)Left breast/ 50.4 Gy in 28 fractions   2) Boost/ (planned)14 Gy in 7 fx, (recieved due to insurance) 12 Gy in 6 fx  Beams/energy:   1) 3D / 10X, 6X        2) Isodose Plan / 10X, 6X  Narrative: The patient tolerated radiation treatment relatively well.   Patient had several symptoms including: peeling under the axilla, erythema on breast, and dry/flaky nipple area.  Plan: The patient has completed radiation treatment. The patient will return to radiation oncology clinic for routine followup in one month. I advised them to call or return sooner if they have any questions or concerns related to their recovery or treatment.  ------------------------------------------------  Jodelle Gross, MD, PhD  This document serves as a record of services personally performed by Kyung Rudd, MD. It was created on his behalf by Bethann Humble, a trained medical scribe. The creation of this record is based on the scribe's personal observations and the provider's statements to them. This document has been checked and approved by the attending provider.

## 2016-04-25 ENCOUNTER — Telehealth: Payer: Self-pay | Admitting: *Deleted

## 2016-04-25 NOTE — Telephone Encounter (Signed)
"  I will be off Capecitabine the week of October 25th through October 31st.  I was to call Dr. Burr Medico to schedule F/U the week I'm off medication.  I cannot come in Friday October 27th but any other day works."

## 2016-04-25 NOTE — Telephone Encounter (Signed)
I will send a scheduling message   Ajwa Kimberley MD  

## 2016-05-02 ENCOUNTER — Telehealth: Payer: Self-pay | Admitting: *Deleted

## 2016-05-02 NOTE — Telephone Encounter (Signed)
Spoke with Brooke Garrison and was informed that cramping had stopped since talking to triage nurse this am.  Took Imodium 1 tablet per triage nurse advise, diarrhea symptom resolved.  Has low grade fever 99.6.   Reinforced that Brooke Garrison needs to take antiemetics as needed, antidiarrheal meds as needed.  Reinforced that Brooke Garrison needed to push po fluids as tolerated. Instructed Brooke Garrison to  STOP  Xeloda today, Fri, Sat, and Sun.  Asked Brooke Garrison to call office to update her progress.  If all symptoms resolved by Monday, Brooke Garrison can Resume  Xeloda  - as per Dr. Ernestina Penna instructions. Brooke Garrison understood that if she experiences increased temp, feeling dizzy, or nausea/vomiting occur despite taking meds, Brooke Garrison needs to go to ER for further evaluation.

## 2016-05-02 NOTE — Telephone Encounter (Signed)
"  I need to know if I need to go to a [place] ED.  Have used Xeloda for eight days.  I'm having several loose stools.  The past two days I average four to five.  Today I just dribbled then a very large liquid stool  Having abdominal cramps and nausea.  Eating well but nothing eaten yet today.  I've been up since 4:00 am with cramps." Admits to having Zofran and compazine on hand from IV chemotherapy and imodium.  Will notify provider.  return number [phone].  Awaiting return call from provider.  These are all side effects of Xeloda.  Encouraged to drink fluids to avert dehydration.  Use anti-emetic and try imodium.  If she experiences symptoms of dehydration to report to the nearest ED.

## 2016-05-07 ENCOUNTER — Telehealth: Payer: Self-pay | Admitting: *Deleted

## 2016-05-07 NOTE — Telephone Encounter (Signed)
Late Entry:  Talked with pt yest pm & she reports that she is still having runny yellow diarrhea with cramping & this is happening 3-4 x's/d.  She states the intensity is less but diarrhea continues.  She is down 5 lbs & can't eat b/c of smells/taste.  She states "I want to call everything to a halt; I've been treated for 7 months now & I'm willing to take my chances for reoccurrence.  She would like her records sent to Dr Li/Oncologist/[place] at [facility] [address] & request records be sent there.   Discussed with Dr Burr Medico today & she will call pt. Message left with HIM to fax/mail records to mentioned office so she may get an appt.

## 2016-05-10 ENCOUNTER — Telehealth: Payer: Self-pay | Admitting: Hematology

## 2016-05-10 NOTE — Telephone Encounter (Signed)
FAXED PT Brooke Garrison, [phone]. PAM FROM DR LI'S OFFICE WILL CALL PT WITH APPT.

## 2016-05-14 ENCOUNTER — Ambulatory Visit: Payer: Managed Care, Other (non HMO) | Admitting: Hematology

## 2016-05-14 ENCOUNTER — Other Ambulatory Visit: Payer: Managed Care, Other (non HMO)

## 2016-05-16 ENCOUNTER — Telehealth: Payer: Self-pay | Admitting: *Deleted

## 2016-05-16 NOTE — Telephone Encounter (Signed)
"  My doctor requested my records a week and a half ago and not received.  I need to talk with Medical Records."  Call transferred to [facility].M.

## 2016-06-04 ENCOUNTER — Ambulatory Visit: Payer: Self-pay | Admitting: Radiation Oncology

## 2016-06-24 ENCOUNTER — Telehealth: Payer: Self-pay | Admitting: Adult Health

## 2016-06-24 NOTE — Telephone Encounter (Signed)
I received a transfer call from Ms. Lacko with questions regarding her upcoming survivorship appointment.  "What is this appointment about and why do I need to come?"   I provided a brief overview of the purpose of survivorship and the things we would discuss at her Survivorship Care Plan visit.  She was given instructions on where to check in for the appointment, the length of time for the appointment, and was advised that any appropriate co-pay would be due at the time of the visit as well.  She voiced understanding and appreciation.  "I think this is something I want to do."  She already has an appointment scheduled for this Thursday, 06/27/16.  I look forward to participating in her care.   Mike Craze, NP [place] [phone]

## 2016-06-27 ENCOUNTER — Ambulatory Visit (HOSPITAL_BASED_OUTPATIENT_CLINIC_OR_DEPARTMENT_OTHER): Payer: 59 | Admitting: Adult Health

## 2016-06-27 ENCOUNTER — Encounter: Payer: Self-pay | Admitting: Adult Health

## 2016-06-27 VITALS — BP 117/63 | HR 65 | Temp 98.0°F | Resp 20 | Ht 60.0 in | Wt 179.9 lb

## 2016-06-27 DIAGNOSIS — Z170 Estrogen receptor positive status [ER+]: Secondary | ICD-10-CM | POA: Diagnosis not present

## 2016-06-27 DIAGNOSIS — C50412 Malignant neoplasm of upper-outer quadrant of left female breast: Secondary | ICD-10-CM

## 2016-06-27 DIAGNOSIS — G620 Drug-induced polyneuropathy: Secondary | ICD-10-CM | POA: Diagnosis not present

## 2016-06-27 DIAGNOSIS — N951 Menopausal and female climacteric states: Secondary | ICD-10-CM | POA: Diagnosis not present

## 2016-06-27 DIAGNOSIS — G893 Neoplasm related pain (acute) (chronic): Secondary | ICD-10-CM

## 2016-06-27 NOTE — Progress Notes (Signed)
CLINIC:  Survivorship   REASON FOR VISIT:  Routine follow-up post-treatment for a recent history of breast cancer.  BRIEF ONCOLOGIC HISTORY:  Oncology History   Breast cancer of upper-outer quadrant of left female breast [facility])   Staging form: Breast, AJCC 7th Edition     Clinical stage from 09/28/2015: Stage IIA (T2, N0, M0) - Signed by Truitt Merle, MD on 10/03/2015       Breast cancer of upper-outer quadrant of left female breast ([place])   09/27/2015 Initial Diagnosis    Breast cancer of upper-outer quadrant of left female breast ([place])      09/27/2015 Initial Biopsy    Left breast core needle biopsy of the mass at the 3:00 position showed invasive and in situ ductal carcinoma, lymphovascular invasion is identified.      09/27/2015 Receptors her2    ER 95% positive, PR negative, HER-2 negative, Ki-67 60%      09/27/2015 Mammogram    diagnostic mammogram and of her left breast showa 2.3 cm mass in the 3:00 position retroareolar left breast      09/27/2015 Oncotype testing    RS: 55 high risk, the group average risk of distant recurrence for patient's wishes recurrence score>50 was 34%.      10/19/2015 Imaging    Breast MRI with and without contrast showed known 3.5cm left breast malignancy at 3:00 on a background of innumerable enhancing bilateral foci. Additional suspicious mass 19 x 11 x 13 mm at 2:00 position of left breast      10/20/2015 - 12/22/2015 Neo-Adjuvant Chemotherapy    Taxotere/Cytoxan x 4 cycles       11/03/2015 Pathology Results    MRI guided second left breast biopsy showed invasive ductal carcinoma, ER 100% positive, PR negative, HER-2 negative Ki-67 5%      01/17/2016 Surgery    (L) lumpectomy with SLNB Donne Hazel): IDC, 2.1 cm, LVI+, positive margins. 1/4 SLN positive. HER2 repeated and remains negative.    ypT2, ypN1a: Stage IIB      01/23/2016 Surgery    Left breast re-excision Donne Hazel). High grade DCIS involving lobules. No invasive carcinoma identified.         02/26/2016 - 04/12/2016 Radiation Therapy    Adjuvant breast and axilla radiation [facility]). Left breast/ 50.4 Gy in 28 fractions.  Boost/ 12 Gy in 6 fx (14 Gy in 7 fx planned but unable to complete d/t insurance).       04/2016 - 04/2016 Adjuvant Chemotherapy    Adjuvant Xeloda 2000 mg po BID. Cycle #1 tolerated poorly with severe diarrhea, weakness, & fatigue. Xeloda stopped.       06/21/2016 -  Anti-estrogen oral therapy    Femara 2.5 mg daily (by Dr. Ricky Ala at [facility] in [place] [place])        INTERVAL HISTORY:  Brooke Garrison presents to the [facility] today for our initial meeting to review her survivorship care plan detailing her treatment course for breast cancer, as well as monitoring long-term side effects of that treatment, education regarding health maintenance, screening, and overall wellness and health promotion.     Overall, Brooke Garrison reports feeling quite well since completing her radiation therapy approximately 2.5 months ago.  She also completed 1 cycle of adjuvant Xeloda in 04/2016, but this was discontinued due to side effects.  Since her last visit to the cancer center, Brooke Garrison has transferred her medical oncology care to Dr. Ricky Ala at [facility] in  [place], [place], which is closer to her home.    She was started on Femara by Dr. Nicoletta Dress on 06/21/16. She hasn't noted many new side effects yet. She already has vaginal dryness, hot flashes, and arthralgias which have been chronic.  She has been in physical therapy for about 3 weeks for her rheumatoid arthritis associated arthralgias.  She describes her hot flashes as happening about 6x/day and are mostly during the day; they do not happen very often at night.  They last about 3-4 minutes each.   She works in [place] and lives in [place].    REVIEW OF SYSTEMS:  Review of Systems  Constitutional: Positive for fatigue.  HENT:  Negative.   Eyes:  Negative.   Respiratory: Negative.   Cardiovascular: Negative.   Gastrointestinal: Negative.   Endocrine: Positive for hot flashes.  Genitourinary: Positive for dyspareunia.        Vaginal dryness  Musculoskeletal: Positive for arthralgias.  Skin: Negative.   Hematological: Negative.   Breast: Denies any new nodularity, masses, nipple changes, or nipple discharge. (L) breast tenderness/pain.    A 14-point review of systems was completed and was negative, except as noted above.   ONCOLOGY TREATMENT TEAM:  1. Surgeon:  Dr. Donne Hazel at [facility] 2. Medical Oncologist: Dr. Burr Medico; and now Dr. Ricky Ala in [place], [place] 3. Radiation Oncologist: Dr. Lisbeth Renshaw    PAST MEDICAL/SURGICAL HISTORY:  Past Medical History:  Diagnosis Date  . Arthritis    RA-stopped methotrexate and humira  . Breast cancer ([place])   . 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 GERD (gastroesophageal reflux disease)    thruout chemo  . Hypothyroidism   . Thyroid disease    Past Surgical History:  Procedure Laterality Date  . ABDOMINAL HYSTERECTOMY     partial  . RADIOACTIVE SEED GUIDED MASTECTOMY WITH AXILLARY SENTINEL LYMPH NODE BIOPSY Left 01/17/2016   Procedure: DOUBLE RADIOACTIVE SEED GUIDED LEFT BREAST LUMPECTOMY AND LEFT AXILLARY SENTINEL LYMPH NODE BIOPSY ;  Surgeon: Rolm Bookbinder, MD;  Location: [facility];  Service: General;  Laterality: Left;  . RE-EXCISION OF BREAST LUMPECTOMY Left 01/23/2016   Procedure: RE-EXCISION LEFT BREAST LUMPECTOMY;  Surgeon: Rolm Bookbinder, MD;  Location: [facility];  Service: General;  Laterality: Left;     ALLERGIES:  Allergies  Allergen Reactions  . Sulfa Antibiotics Hives    Itching, rash.     CURRENT MEDICATIONS:  Outpatient Encounter Prescriptions as of 06/27/2016  Medication Sig Note  . acyclovir (ZOVIRAX) 400 MG tablet Take 800 mg by mouth daily. 10/04/2015: Received from:  External Pharmacy  . capecitabine (XELODA) 500 MG tablet Take 4 tablets (2,000 mg total) by mouth 2 (two) times daily after a meal.   . cetirizine (ZYRTEC) 10 MG tablet Take 10 mg by mouth daily.   Marland Kitchen liothyronine (CYTOMEL) 5 MCG tablet Take 5 mcg by mouth daily. 10/04/2015: Received from: External Pharmacy  . methotrexate 2.5 MG tablet Take by mouth 3 (three) times a week. Taking 6 tablets once a week   . naproxen (NAPROSYN) 500 MG tablet Take 500 mg by mouth daily.   Marland Kitchen omeprazole (PRILOSEC) 20 MG capsule Take 1 capsule (20 mg total) by mouth daily.   Marland Kitchen oxyCODONE (OXY IR/ROXICODONE) 5 MG immediate release tablet Take 1 tablet (5 mg total) by mouth every 6 (six) hours as needed for moderate pain, severe pain or breakthrough pain. 01/23/2016: Has Rx but not filled  . Serita Grammes  [place] TABLET 6 DAYS A WEEK 10/04/2015: Received from: External Pharmacy  . traMADol (ULTRAM) 50 MG tablet Take 50 mg by mouth daily.   . Wound Dressings (SONAFINE) Apply 1 application topically 3 (three) times daily.    No facility-administered encounter medications on file as of 06/27/2016.      ONCOLOGIC FAMILY HISTORY:  Family History  Problem Relation Age of Onset  . Hodgkin's lymphoma Mother   . Melanoma Maternal Grandfather      GENETIC COUNSELING/TESTING: None.   SOCIAL HISTORY:  Brooke Garrison is married and lives with her husband in [place], [place].  She has 2 children. 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  She denies any current tobacco or illicit drug use. She drinks alcohol occasionally.      PHYSICAL EXAMINATION:  Vital Signs:   Vitals:   06/27/16 1129  BP: 117/63  Pulse: 65  Resp: 20  Temp: 98 F (36.7 C)   Filed Weights   06/27/16 1129  Weight: 179 lb 14.4 oz (81.6 kg)   General: Well-nourished, well-appearing female in no acute distress.  She is unaccompanied today.   HEENT: Head is normocephalic.  Pupils equal and reactive  to light. Conjunctivae clear without exudate.  Sclerae anicteric. Oral mucosa is pink, moist.  Oropharynx is pink without lesions or erythema.  Lymph: No cervical, supraclavicular, or infraclavicular lymphadenopathy noted on palpation.  Cardiovascular: Regular rate and rhythm.Marland Kitchen Respiratory: Clear to auscultation bilaterally. Chest expansion symmetric; breathing non-labored.  Breast Exam:  -Left breast: Mild breast edema & hyperpigmentation s/p radiation therapy. No palpable masses. Healed lumpectomy scar without nodularity. Skin is intact, but dry. No nipple inversion or discharge.  -Right breast: No palpable masses. No skin erythema. No nipple inversion or discharge.  -Axilla: No palpable axillary lymphadenopathy bilaterally.  GI: Abdomen soft and round; non-tender, non-distended. Bowel sounds normoactive.  GU: Deferred.  Neuro: No focal deficits. Steady gait.  Psych: Flat affect; tearful at times during visit.  Extremities: No edema. Skin: Warm and dry.  LABORATORY DATA:  None for this visit.  DIAGNOSTIC IMAGING:  None for this visit.      ASSESSMENT AND PLAN:  Ms.. Garrison is a pleasant 55 y.o. female with Stage IIB left breast invasive ductal carcinoma, ER+/PR-/HER2-, diagnosed in 09/2015; treated with neoadjuvant chemo with Taxotere/Cytoxan x 4 cycles which she completed on 12/22/15. She went on to have lumpectomy and adjuvant radiation therapy and completed radiation on 04/12/16.  Given her positive sentinel lymph node after neoadjuvant chemo, she was started on adjuvant Xeloda in 04/2016. She completed 1 cycle of Xeloda, but this was stopped due to side effects. She now has transferred her care to [facility] under the care of Dr. Ricky Ala. Dr. Nicoletta Dress started her on Femara on 06/21/16. She presents to the Survivorship Clinic for our initial meeting and routine follow-up post-completion of treatment for breast cancer.    1. Stage IIB left breast cancer:  Brooke Garrison is continuing to  recover from definitive treatment for breast cancer. She has transitioned her follow-up care closer to home to Dr. Pamella Pert Li's office in [place] with the [facility] (which is a [facility] facility). She will continue her anti-estrogen therapy with Letrozole.  She will be due for annual mammogram in 09/2016.  Since she will not have any additional medical oncology follow-up here at the cancer center, I encouraged her to talk with Dr. Nicoletta Dress about where he prefers for  her to get her annual diagnostic mammograms done so that he will receive the results for his review.  We reviewed the importance of maintaining adequate follow-up with her oncology specialists in continued surveillance of breast cancer.  Today, a comprehensive survivorship care plan and treatment summary was reviewed with the patient today detailing her breast cancer diagnosis, treatment course, potential late/long-term effects of treatment, appropriate follow-up care with recommendations for the future, and patient education resources.  She asked that I share a copy of her survivorship care plan document with her new medical oncologist, Dr. Nicoletta Dress, instead of her PCP.  Therefore, I will plan to forward a copy to Dr. August Saucer office after today's visit.    2. Hot flashes: Brooke Garrison has been experiencing hot flashes for quite some time. We discussed that certainly the letrozole could make the hot flashes worse. We discussed several pharmacologic interventions that can be helpful in managing hot flashes and women with a history of breast cancer. One option that I think may be helpful for her would be Effexor XR. We discussed the dosing schedule for this medication; I would recommend starting with 37.5 mg per day, with the understanding that we may need to increase the dose to desired controlled symptoms. However, I will defer to her medical oncologist to prescribe this medication if he feels that it is appropriate in the future given that the  patient will not be seen for regular follow-up here at our cancer center. Another nonhormonal option for her hot flashes would be gabapentin, which is generally we give as a 300 mg one time nightly dose; this would be a great option for her should she start having night sweats with the letrozole.  Lastly, some women find been effective in taking vitamin E supplementation in helping manage her hot flashes. This can certainly be an easy option for her to try as well, and largely has no side effects. She will discuss these options with Dr. Nicoletta Dress in the future, when needed.  3. Vaginal dryness: She is experiencing vaginal dryness, as well as dyspareunia. We discussed that coconut oil can often act as a great vaginal moisturizer as well as a lubricant for intercourse. I gave her instructions on how to use the coconut oil and encouraged her to use it is often as she needed for vaginal dryness. Again, she understands that the vaginal dryness may worsen some while she is taking antiestrogen therapy.  4. Left breast pain: I provided reassurance that breast pain following lumpectomy very common and generally not suggestive of breast cancer recurrence or a new breast cancer. We discussed  the likely etiology of her pain is from her surgery and adjuvant radiation therapy, as neuropathic pain after these treatments is very common. Likely, these symptoms will continue to improve with time, but can take as much as 1+ years to resolve.  5. Post-radiation skin care: Her left breast is very dry and mildly hyperpigmented on exam today, but largely healing very well from radiation therapy. I encouraged her to liberally apply lotion with vitamin E and/or plain vitamin E oil to the areas treated with radiation. She understands that the skin treated with radiation will be very dry, requiring additional moisturization.   6. 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  She endorses having fears of cancer recurrence. I normalized 6 concerns for her, as they  are very common in patients who have completed cancer treatment. She states, "I just wanted to get back to some type of a normal life." We discussed an opportunity for her to participate in the next session of [facility] ("Finding Your New Normal") support group series designed for patients after they have completed treatment.  She tells me this series may be difficult for her given her work schedule (she does work in [place], but lives in [place]). I highly recommended that she consider attending the next session of [facility], because I think it will give her a lot of new tools to help her as she continues to physically and emotionally recover from her cancer diagnosis and treatment.   Brooke Garrison was also encouraged to take advantage of our many other support services programs, support groups, and/or counseling in coping with her new life as a cancer survivor after completing anti-cancer treatment.  She was offered support today through active listening and expressive supportive counseling.  She was given information regarding our available services and encouraged to contact me with any questions or for help enrolling in any of our support group/programs.   7. Bone health:  Given Brooke Garrison's age, history of breast cancer, and her current treatment regimen including anti-estrogen therapy with letrozole, she is at risk for bone demineralization. We do not have her most recent DEXA scan records available for review; she tells me her last DEXA scan showed osteopenia. She generally has a DEXA scan every 2 years given the medications she has to take for her rheumatoid arthritis. I encouraged her to share the results of her DEXA scan with her medical oncologist, as it will be helpful for him to know her current bone density with initiation of an aromatase inhibitor. She  understands that letrozole can further weaken her bone density.  In the meantime, she was encouraged to increase her consumption of foods rich in calcium, as well as increase her weight-bearing activities.  She was given education on specific activities to promote bone health.  8. Cancer screening:  Due to Brooke Garrison's history and her age, she should receive screening for skin cancers, colon cancer, and gynecologic cancers.  The information and recommendations are listed on the patient's comprehensive care plan/treatment summary and were reviewed in detail with the patient.    9. Health maintenance and wellness promotion: Brooke Garrison was encouraged to consume 5-7 servings of fruits and vegetables per day. We reviewed the "Nutrition Rainbow" handout, as well as the handout "Take Control of Your Health and Reduce Your Cancer Risk" from the [facility].  She was also encouraged to engage in moderate to vigorous exercise for 30 minutes per day most days of the week. We discussed the [employer] fitness program, which is designed for cancer survivors to help them become more physically fit after cancer treatments.  She was instructed to limit her alcohol consumption and continue to abstain from tobacco use.        Dispo:   -She will maintain follow-up with her medical oncologist, Dr. Ricky Ala, in [place] from this point forward.  -I wished her well and encouraged her to let us know how we can continue to support her going forward.    A total of 60 minutes of face-to-face time was spent with this patient with greater than 50% of that time in counseling and care-coordination.   Mike Craze, NP Survivorship Program [place] [phone]   Note: PRIMARY CARE PROVIDER Larence Penning, MD  [phone]  

## 2016-07-24 ENCOUNTER — Encounter: Payer: Managed Care, Other (non HMO) | Admitting: Adult Health

## 2017-06-11 IMAGING — MR MR BREAST BILAT WO/W CM
8 of 12 series · 33 of 48 positions shown · IV contrast (17ml Multihance)
Comparison: 10/19/2015 breast MRI.

CLINICAL DATA: History of multifocal left breast invasive mammary
carcinoma. Patient currently undergoing neoadjuvant therapy.
Evaluate response to treatment.

LABS:  Not applicable
EXAM:
BILATERAL BREAST MRI WITH AND WITHOUT CONTRAST
TECHNIQUE: Multiplanar, multisequence MR images of both breasts were obtained
prior to and following the intravenous administration of 17 ml of
MultiHance.

[Series 2: t2_tirm_tra ipat (a-p) · axial · 3.0mm · 0.70mm/px · 1 of 55 slices shown]
[im 1/55]
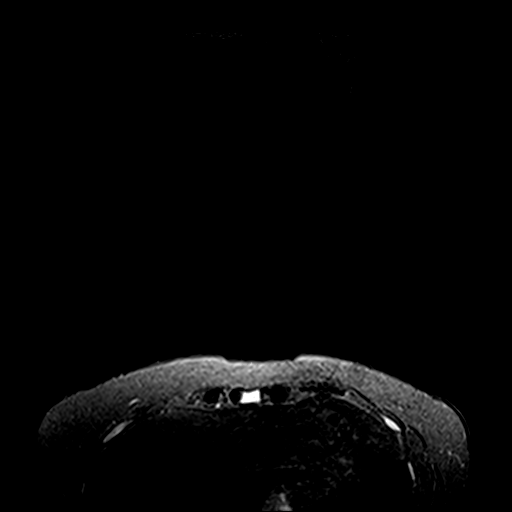

[Series 3: fl3d pre-cm no · axial · non-contrast · 1.2mm · 0.94mm/px · z∈[-74,+97]mm · 5 of 144 slices shown]
[im 1/144]
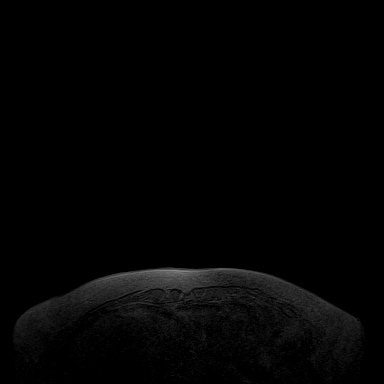
[im 36/144]
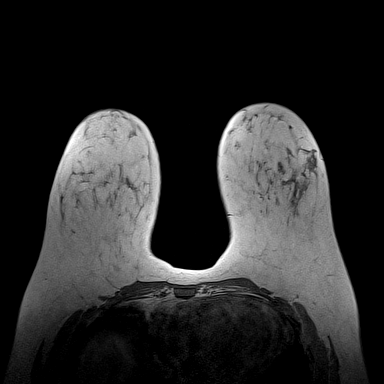
[im 72/144]
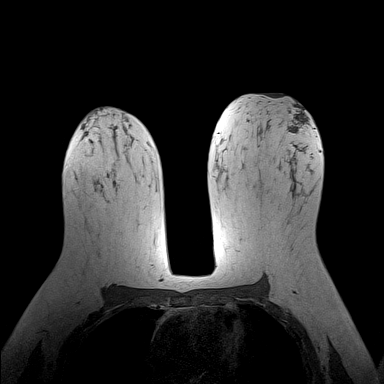
[im 108/144]
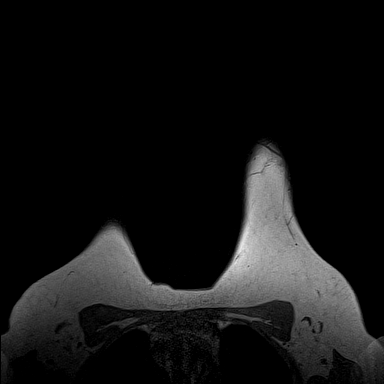
[im 144/144]
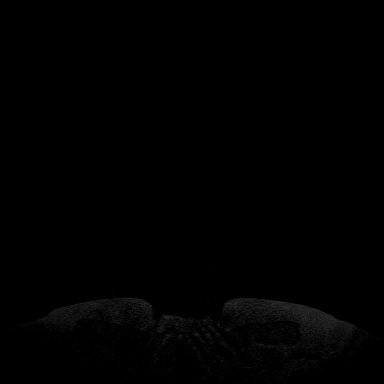

[Series 4: fl3d pre-cm · axial · non-contrast · 1.2mm · 0.94mm/px · z∈[-74,+97]mm · 5 of 144 slices shown]
[im 1/144]
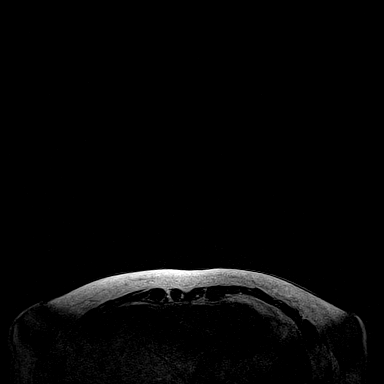
[im 36/144]
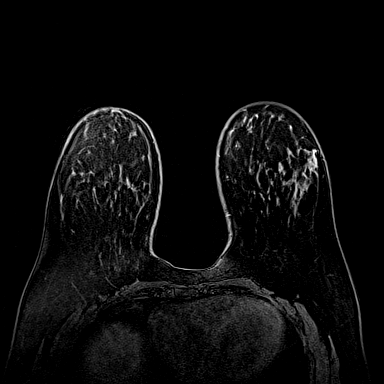
[im 72/144]
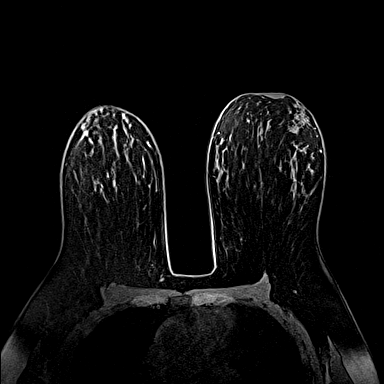
[im 108/144]
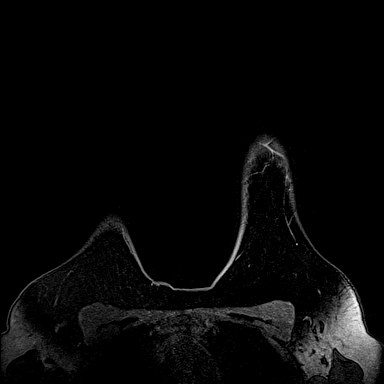
[im 144/144]
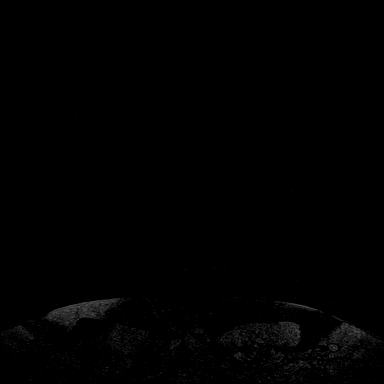

[Series 5: fl3d post-cm 20 · axial · 1.2mm · 0.94mm/px · z∈[-74,+97]mm · 5 of 144 slices shown (1 of 3)]
[im 1/144]
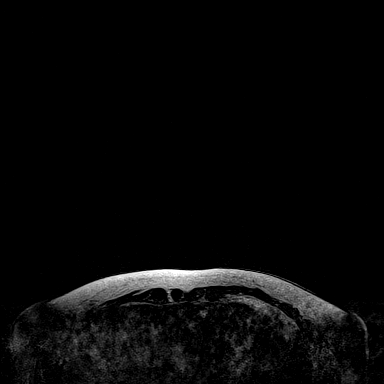
[im 36/144]
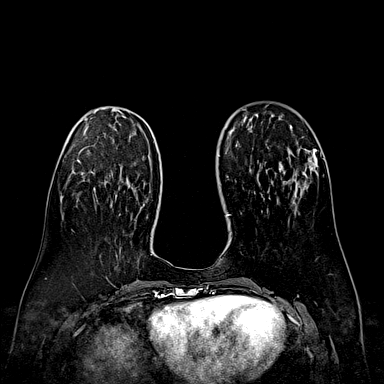
[im 72/144]
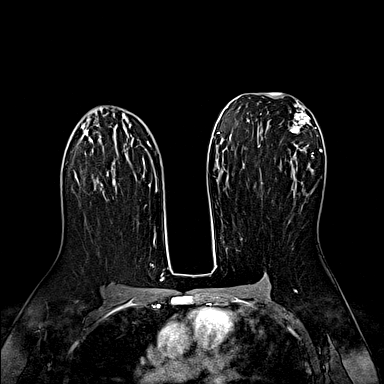
[im 108/144]
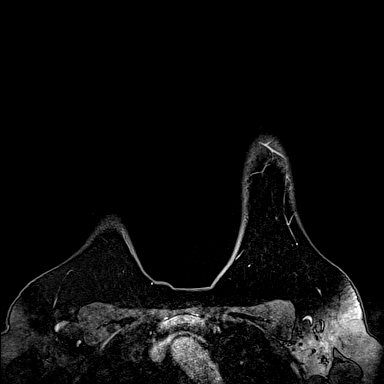
[im 144/144]
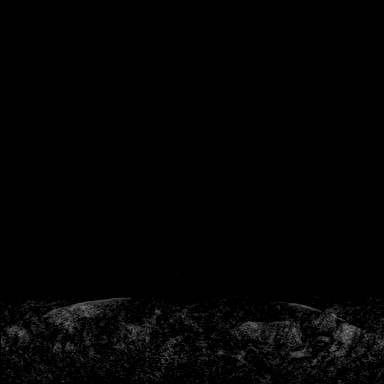

[Series 6: fl3d post-cm 20 · axial · 1.2mm · 0.94mm/px · z∈[-74,+97]mm · 5 of 144 slices shown (2 of 3)]
[im 1/144]
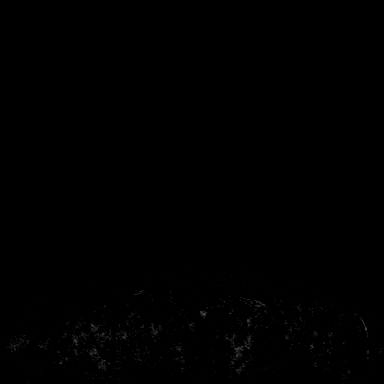
[im 36/144]
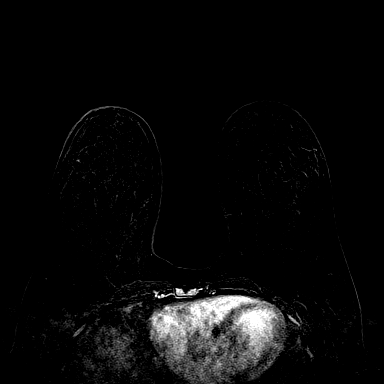
[im 72/144]
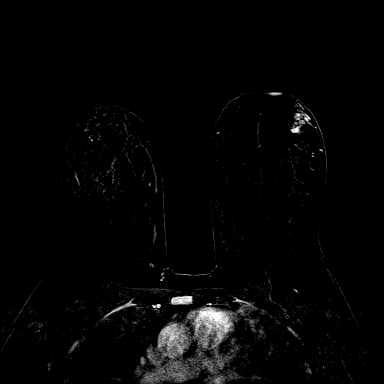
[im 108/144]
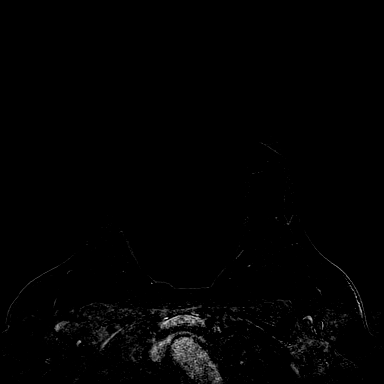
[im 144/144]
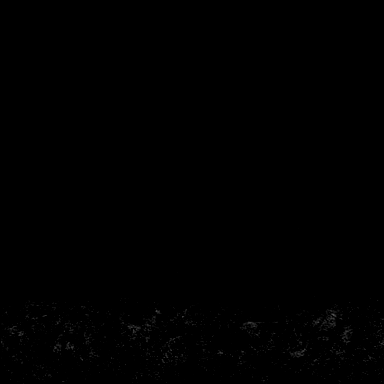

[Series 7: fl3d post-cm 20 · axial · 172.8mm · 0.94mm/px · 1 of 1 slices shown (3 of 3)]
[im 1/1]
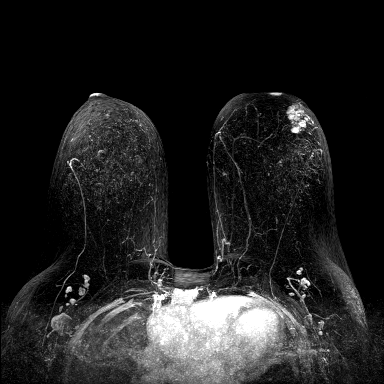

[Series 8: fl3d post-cm 3min · axial · 1.2mm · 0.94mm/px · z∈[-74,+97]mm · 6 of 144 slices shown]
[im 1/144]
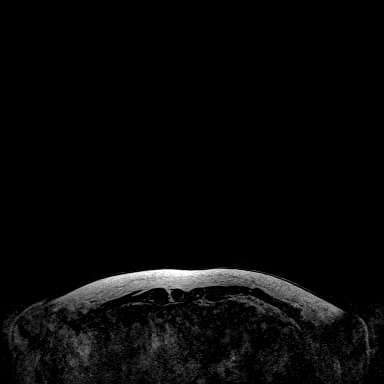
[im 29/144]
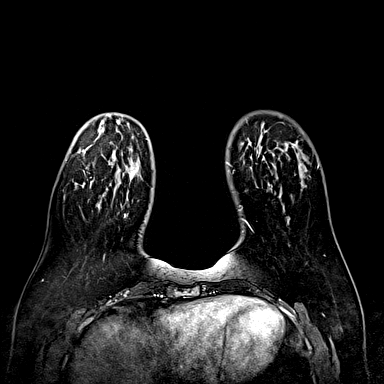
[im 58/144]
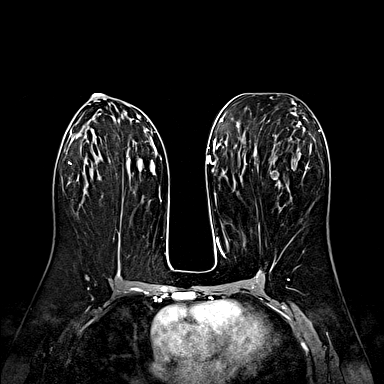
[im 86/144]
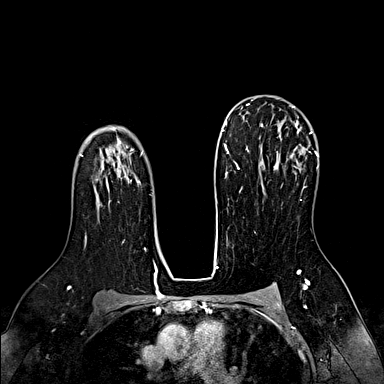
[im 115/144]
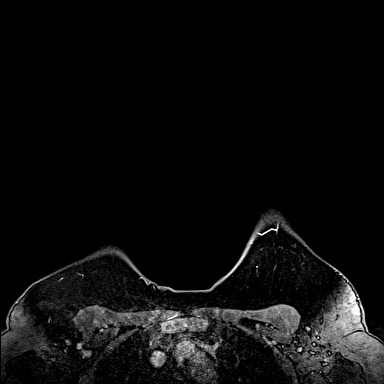
[im 144/144]
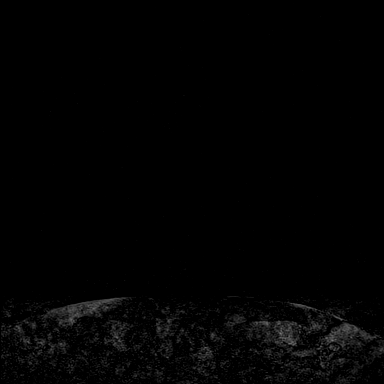

[Series 9: fl3d post-cm 3min_sub · axial · 1.2mm · 0.94mm/px · z∈[-74,+62]mm · 5 of 144 slices shown]
[im 1/144]
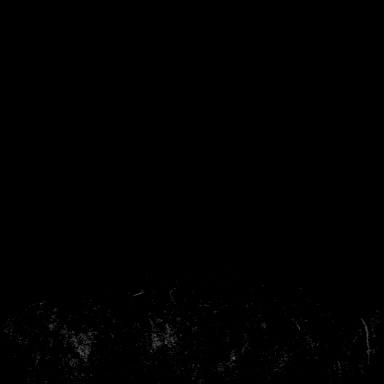
[im 29/144]
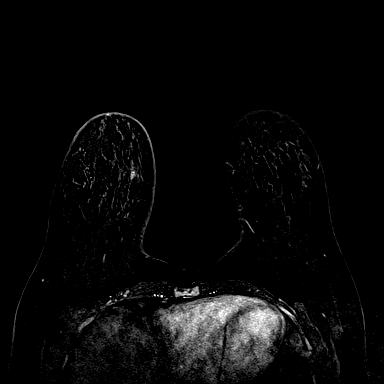
[im 58/144]
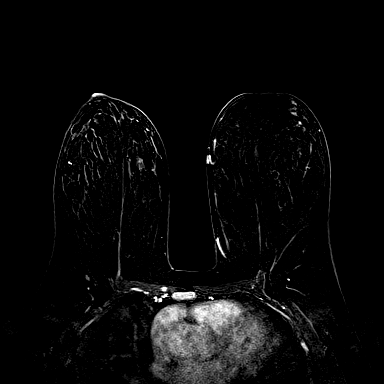
[im 86/144]
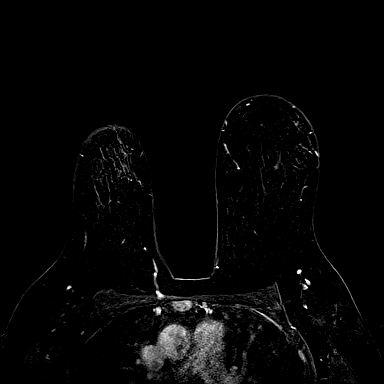
[im 115/144]
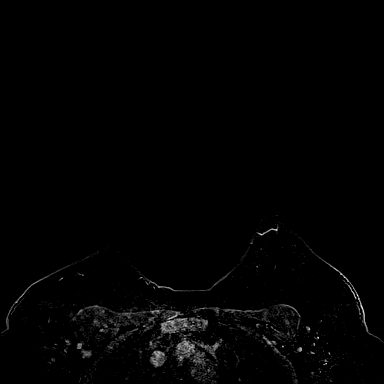

[33 of 48 positions shown; findings below may reference images not displayed]

THREE-DIMENSIONAL MR IMAGE RENDERING ON INDEPENDENT WORKSTATION:

Three-dimensional MR images were rendered by post-processing of the
original MR data on an independent workstation. The
three-dimensional MR images were interpreted, and findings are
reported in the following complete MRI report for this study. Three
dimensional images were evaluated at the independent [employer]
workstation
FINDINGS: Breast composition: c. Heterogeneous fibroglandular tissue.

Background parenchymal enhancement: Moderate.

Right breast: No mass or abnormal enhancement.

Left breast: The irregular enhancing mass located within the left
breast at the 3 o'clock position has decreased in size now measuring
2.5 x 1.5 x 1.4 cm in size. Previously this measured 3.5 x 3.5 x
cm in size. In addition, the smaller more superiorly located tumor
located at the 2 o'clock position has also decreased in size. This
previously measured 1.9 x 1.3 x 1.1 cm in size and on today's study
only vague non measurable residual enhancement is present. There are
no new lesions.

Lymph nodes: No abnormal appearing lymph nodes.

Ancillary findings:  None.
IMPRESSION: Decrease in size of the masses located within the left breast
following neoadjuvant chemotherapy as discussed above.

RECOMMENDATION:
Treatment plan.

BI-RADS CATEGORY  6: Known biopsy-proven malignancy.
